# Patient Record
Sex: Female | Born: 1975 | Race: White | Hispanic: No | Smoking: Current every day smoker
Health system: Southern US, Community
[De-identification: ages and names within clinical notes are randomized; demographics above are authoritative.]

## PROBLEM LIST (undated history)

## (undated) DIAGNOSIS — F419 Anxiety disorder, unspecified: Secondary | ICD-10-CM

## (undated) DIAGNOSIS — K219 Gastro-esophageal reflux disease without esophagitis: Secondary | ICD-10-CM

## (undated) DIAGNOSIS — S1121XA Laceration without foreign body of pharynx and cervical esophagus, initial encounter: Secondary | ICD-10-CM

## (undated) DIAGNOSIS — F1110 Opioid abuse, uncomplicated: Secondary | ICD-10-CM

## (undated) HISTORY — PX: TONSILLECTOMY: SUR1361

## (undated) HISTORY — PX: NASAL SINUS SURGERY: SHX719

## (undated) HISTORY — PX: ABDOMINAL HYSTERECTOMY: SHX81

## (undated) HISTORY — PX: CHOLECYSTECTOMY: SHX55

---

## 2004-05-08 ENCOUNTER — Emergency Department: Payer: Self-pay | Admitting: Emergency Medicine

## 2004-06-03 ENCOUNTER — Observation Stay: Payer: Self-pay | Admitting: Unknown Physician Specialty

## 2004-06-10 ENCOUNTER — Observation Stay: Payer: Self-pay | Admitting: Obstetrics & Gynecology

## 2004-06-13 ENCOUNTER — Inpatient Hospital Stay: Payer: Self-pay | Admitting: Unknown Physician Specialty

## 2006-03-05 ENCOUNTER — Emergency Department: Payer: Self-pay | Admitting: Emergency Medicine

## 2006-04-13 ENCOUNTER — Emergency Department: Payer: Self-pay | Admitting: Emergency Medicine

## 2006-04-13 ENCOUNTER — Other Ambulatory Visit: Payer: Self-pay

## 2006-07-26 ENCOUNTER — Other Ambulatory Visit: Payer: Self-pay

## 2006-07-26 ENCOUNTER — Emergency Department: Payer: Self-pay | Admitting: Emergency Medicine

## 2006-08-27 ENCOUNTER — Ambulatory Visit: Payer: Self-pay | Admitting: Surgery

## 2006-11-26 ENCOUNTER — Emergency Department: Payer: Self-pay | Admitting: Emergency Medicine

## 2006-12-04 ENCOUNTER — Inpatient Hospital Stay: Payer: Self-pay | Admitting: Unknown Physician Specialty

## 2007-06-16 ENCOUNTER — Emergency Department: Payer: Self-pay | Admitting: Emergency Medicine

## 2007-07-04 ENCOUNTER — Emergency Department: Payer: Self-pay | Admitting: Emergency Medicine

## 2007-10-27 ENCOUNTER — Emergency Department: Payer: Self-pay | Admitting: Emergency Medicine

## 2008-02-10 ENCOUNTER — Other Ambulatory Visit: Payer: Self-pay

## 2008-02-10 ENCOUNTER — Emergency Department: Payer: Self-pay | Admitting: Emergency Medicine

## 2008-02-14 ENCOUNTER — Other Ambulatory Visit: Payer: Self-pay

## 2008-02-14 ENCOUNTER — Emergency Department: Payer: Self-pay | Admitting: Emergency Medicine

## 2008-02-15 ENCOUNTER — Ambulatory Visit: Payer: Self-pay | Admitting: Oncology

## 2008-02-20 ENCOUNTER — Ambulatory Visit: Payer: Self-pay | Admitting: Oncology

## 2008-03-17 ENCOUNTER — Ambulatory Visit: Payer: Self-pay | Admitting: Oncology

## 2008-04-09 ENCOUNTER — Emergency Department: Payer: Self-pay | Admitting: Emergency Medicine

## 2008-04-09 ENCOUNTER — Other Ambulatory Visit: Payer: Self-pay

## 2008-04-16 ENCOUNTER — Inpatient Hospital Stay: Payer: Self-pay | Admitting: Psychiatry

## 2009-05-20 ENCOUNTER — Emergency Department: Payer: Self-pay | Admitting: Neurology

## 2009-05-21 ENCOUNTER — Emergency Department: Payer: Self-pay | Admitting: Emergency Medicine

## 2009-05-22 ENCOUNTER — Inpatient Hospital Stay: Payer: Self-pay | Admitting: Psychiatry

## 2009-08-24 ENCOUNTER — Observation Stay: Payer: Self-pay | Admitting: Internal Medicine

## 2009-08-25 ENCOUNTER — Inpatient Hospital Stay: Payer: Self-pay | Admitting: Psychiatry

## 2009-09-29 ENCOUNTER — Emergency Department: Payer: Self-pay | Admitting: Emergency Medicine

## 2010-02-20 ENCOUNTER — Emergency Department: Payer: Self-pay | Admitting: Emergency Medicine

## 2010-03-15 ENCOUNTER — Inpatient Hospital Stay: Payer: Self-pay | Admitting: Unknown Physician Specialty

## 2010-08-19 ENCOUNTER — Emergency Department: Payer: Self-pay | Admitting: Emergency Medicine

## 2012-06-07 ENCOUNTER — Emergency Department: Payer: Self-pay | Admitting: Emergency Medicine

## 2012-06-07 LAB — CBC WITH DIFFERENTIAL/PLATELET
Basophil %: 0.7 %
Eosinophil #: 0.2 10*3/uL (ref 0.0–0.7)
Eosinophil %: 1.5 %
HGB: 13.6 g/dL (ref 12.0–16.0)
Lymphocyte #: 3.4 10*3/uL (ref 1.0–3.6)
Lymphocyte %: 31.6 %
MCH: 32.4 pg (ref 26.0–34.0)
MCV: 92 fL (ref 80–100)
Monocyte %: 5.4 %
Neutrophil #: 6.5 10*3/uL (ref 1.4–6.5)
Platelet: 188 10*3/uL (ref 150–440)
RBC: 4.19 10*6/uL (ref 3.80–5.20)
RDW: 12.1 % (ref 11.5–14.5)
WBC: 10.7 10*3/uL (ref 3.6–11.0)

## 2012-06-25 ENCOUNTER — Ambulatory Visit: Payer: Self-pay

## 2012-06-26 ENCOUNTER — Ambulatory Visit: Payer: Self-pay

## 2012-09-07 ENCOUNTER — Emergency Department: Payer: Self-pay | Admitting: Emergency Medicine

## 2013-05-27 ENCOUNTER — Emergency Department: Payer: Self-pay | Admitting: Emergency Medicine

## 2013-05-27 LAB — COMPREHENSIVE METABOLIC PANEL
Albumin: 3.8 g/dL (ref 3.4–5.0)
Alkaline Phosphatase: 98 U/L (ref 50–136)
BUN: 10 mg/dL (ref 7–18)
Bilirubin,Total: 0.6 mg/dL (ref 0.2–1.0)
Calcium, Total: 9 mg/dL (ref 8.5–10.1)
Chloride: 107 mmol/L (ref 98–107)
Co2: 24 mmol/L (ref 21–32)
Creatinine: 0.85 mg/dL (ref 0.60–1.30)
EGFR (African American): >60
Osmolality: 276 (ref 275–301)
Potassium: 3.3 mmol/L — ABNORMAL LOW (ref 3.5–5.1)
SGOT(AST): 20 U/L (ref 15–37)
Sodium: 138 mmol/L (ref 136–145)

## 2013-05-27 LAB — CBC
HGB: 14.3 g/dL (ref 12.0–16.0)
MCH: 32.2 pg (ref 26.0–34.0)
MCHC: 35.3 g/dL (ref 32.0–36.0)
MCV: 91 fL (ref 80–100)
Platelet: 210 10*3/uL (ref 150–440)
WBC: 16.7 10*3/uL — ABNORMAL HIGH (ref 3.6–11.0)

## 2014-02-02 LAB — COMPREHENSIVE METABOLIC PANEL
ALK PHOS: 322 U/L — AB
ALT: 27 U/L (ref 12–78)
ANION GAP: 12 (ref 7–16)
Albumin: 2.2 g/dL — ABNORMAL LOW (ref 3.4–5.0)
BUN: 7 mg/dL (ref 7–18)
Bilirubin,Total: 1.7 mg/dL — ABNORMAL HIGH (ref 0.2–1.0)
CREATININE: 1.28 mg/dL (ref 0.60–1.30)
Calcium, Total: 8.5 mg/dL (ref 8.5–10.1)
Chloride: 103 mmol/L (ref 98–107)
Co2: 22 mmol/L (ref 21–32)
EGFR (African American): >60
EGFR (Non-African Amer.): 54 — ABNORMAL LOW
GLUCOSE: 128 mg/dL — AB (ref 65–99)
OSMOLALITY: 273 (ref 275–301)
Potassium: 2.9 mmol/L — ABNORMAL LOW (ref 3.5–5.1)
SGOT(AST): 58 U/L — ABNORMAL HIGH (ref 15–37)
Sodium: 137 mmol/L (ref 136–145)
TOTAL PROTEIN: 7.9 g/dL (ref 6.4–8.2)

## 2014-02-02 LAB — URINALYSIS, COMPLETE
Bilirubin,UR: NEGATIVE
Glucose,UR: NEGATIVE mg/dL (ref 0–75)
KETONE: NEGATIVE
Nitrite: POSITIVE
Ph: 5 (ref 4.5–8.0)
Protein: 30
RBC,UR: 2 /HPF (ref 0–5)
Specific Gravity: 1.008 (ref 1.003–1.030)
Squamous Epithelial: 10
WBC UR: 150 /HPF (ref 0–5)

## 2014-02-02 LAB — CBC WITH DIFFERENTIAL/PLATELET
BASOS PCT: 0.2 %
Basophil #: 0 10*3/uL (ref 0.0–0.1)
EOS ABS: 0 10*3/uL (ref 0.0–0.7)
Eosinophil %: 0.1 %
HCT: 27.3 % — ABNORMAL LOW (ref 35.0–47.0)
HGB: 9.1 g/dL — AB (ref 12.0–16.0)
LYMPHS ABS: 1.4 10*3/uL (ref 1.0–3.6)
LYMPHS PCT: 5.2 %
MCH: 30.7 pg (ref 26.0–34.0)
MCHC: 33.2 g/dL (ref 32.0–36.0)
MCV: 93 fL (ref 80–100)
MONO ABS: 1.3 x10 3/mm — AB (ref 0.2–0.9)
MONOS PCT: 4.8 %
NEUTROS PCT: 89.7 %
Neutrophil #: 23.7 10*3/uL — ABNORMAL HIGH (ref 1.4–6.5)
PLATELETS: 340 10*3/uL (ref 150–440)
RBC: 2.95 10*6/uL — ABNORMAL LOW (ref 3.80–5.20)
RDW: 13.2 % (ref 11.5–14.5)
WBC: 26.4 10*3/uL — ABNORMAL HIGH (ref 3.6–11.0)

## 2014-02-02 LAB — LIPASE, BLOOD: LIPASE: 111 U/L (ref 73–393)

## 2014-02-02 LAB — HCG, QUANTITATIVE, PREGNANCY: Beta Hcg, Quant.: 1 m[IU]/mL

## 2014-02-03 ENCOUNTER — Inpatient Hospital Stay: Payer: Self-pay | Admitting: Internal Medicine

## 2014-02-03 LAB — MAGNESIUM
MAGNESIUM: 1.6 mg/dL — AB
Magnesium: 2.3 mg/dL

## 2014-02-03 LAB — POTASSIUM: POTASSIUM: 3.1 mmol/L — AB (ref 3.5–5.1)

## 2014-02-04 LAB — CBC WITH DIFFERENTIAL/PLATELET
Basophil #: 0 10*3/uL (ref 0.0–0.1)
Basophil %: 0.4 %
EOS PCT: 0.3 %
Eosinophil #: 0 10*3/uL (ref 0.0–0.7)
HCT: 22.2 % — ABNORMAL LOW (ref 35.0–47.0)
HGB: 7.3 g/dL — AB (ref 12.0–16.0)
LYMPHS ABS: 2.1 10*3/uL (ref 1.0–3.6)
Lymphocyte %: 18.1 %
MCH: 31.2 pg (ref 26.0–34.0)
MCHC: 32.9 g/dL (ref 32.0–36.0)
MCV: 95 fL (ref 80–100)
Monocyte #: 0.9 x10 3/mm (ref 0.2–0.9)
Monocyte %: 7.3 %
Neutrophil #: 8.7 10*3/uL — ABNORMAL HIGH (ref 1.4–6.5)
Neutrophil %: 73.9 %
PLATELETS: 336 10*3/uL (ref 150–440)
RBC: 2.34 10*6/uL — ABNORMAL LOW (ref 3.80–5.20)
RDW: 13.7 % (ref 11.5–14.5)
WBC: 11.8 10*3/uL — ABNORMAL HIGH (ref 3.6–11.0)

## 2014-02-04 LAB — COMPREHENSIVE METABOLIC PANEL
ALT: 26 U/L
ANION GAP: 5 — AB (ref 7–16)
Albumin: 1.6 g/dL — ABNORMAL LOW (ref 3.4–5.0)
Alkaline Phosphatase: 210 U/L — ABNORMAL HIGH
BILIRUBIN TOTAL: 0.7 mg/dL (ref 0.2–1.0)
BUN: 5 mg/dL — AB (ref 7–18)
CHLORIDE: 114 mmol/L — AB (ref 98–107)
Calcium, Total: 8 mg/dL — ABNORMAL LOW (ref 8.5–10.1)
Co2: 25 mmol/L (ref 21–32)
Creatinine: 1.13 mg/dL (ref 0.60–1.30)
EGFR (Non-African Amer.): >60
Glucose: 88 mg/dL (ref 65–99)
OSMOLALITY: 284 (ref 275–301)
Potassium: 3.7 mmol/L (ref 3.5–5.1)
SGOT(AST): 31 U/L (ref 15–37)
SODIUM: 144 mmol/L (ref 136–145)
Total Protein: 6.2 g/dL — ABNORMAL LOW (ref 6.4–8.2)

## 2014-02-04 LAB — RAPID INFLUENZA A&B ANTIGENS (ARMC ONLY)

## 2014-02-05 LAB — BASIC METABOLIC PANEL
Anion Gap: 6 — ABNORMAL LOW (ref 7–16)
BUN: 7 mg/dL (ref 7–18)
CALCIUM: 8.2 mg/dL — AB (ref 8.5–10.1)
CHLORIDE: 110 mmol/L — AB (ref 98–107)
CO2: 25 mmol/L (ref 21–32)
Creatinine: 1.03 mg/dL (ref 0.60–1.30)
EGFR (African American): >60
EGFR (Non-African Amer.): >60
GLUCOSE: 139 mg/dL — AB (ref 65–99)
Osmolality: 281 (ref 275–301)
POTASSIUM: 3.4 mmol/L — AB (ref 3.5–5.1)
Sodium: 141 mmol/L (ref 136–145)

## 2014-02-05 LAB — CBC WITH DIFFERENTIAL/PLATELET
BASOS PCT: 0.5 %
Basophil #: 0 10*3/uL (ref 0.0–0.1)
EOS PCT: 1.1 %
Eosinophil #: 0.1 10*3/uL (ref 0.0–0.7)
HCT: 23.8 % — ABNORMAL LOW (ref 35.0–47.0)
HGB: 7.8 g/dL — AB (ref 12.0–16.0)
Lymphocyte #: 1.6 10*3/uL (ref 1.0–3.6)
Lymphocyte %: 23.8 %
MCH: 31 pg (ref 26.0–34.0)
MCHC: 32.7 g/dL (ref 32.0–36.0)
MCV: 95 fL (ref 80–100)
MONOS PCT: 5.1 %
Monocyte #: 0.4 x10 3/mm (ref 0.2–0.9)
Neutrophil #: 4.8 10*3/uL (ref 1.4–6.5)
Neutrophil %: 69.5 %
Platelet: 398 10*3/uL (ref 150–440)
RBC: 2.5 10*6/uL — ABNORMAL LOW (ref 3.80–5.20)
RDW: 13.6 % (ref 11.5–14.5)
WBC: 6.9 10*3/uL (ref 3.6–11.0)

## 2014-02-05 LAB — URINE CULTURE

## 2014-02-06 LAB — URINE CULTURE

## 2014-02-07 LAB — CULTURE, BLOOD (SINGLE)

## 2014-02-25 ENCOUNTER — Emergency Department: Payer: Self-pay | Admitting: Student

## 2014-02-25 LAB — URINALYSIS, COMPLETE
BILIRUBIN, UR: NEGATIVE
BLOOD: NEGATIVE
Bacteria: NONE SEEN
Glucose,UR: NEGATIVE mg/dL (ref 0–75)
Hyaline Cast: 6
Ketone: NEGATIVE
Nitrite: NEGATIVE
PROTEIN: NEGATIVE
Ph: 5 (ref 4.5–8.0)
SPECIFIC GRAVITY: 1.024 (ref 1.003–1.030)

## 2014-02-25 LAB — LIPASE, BLOOD: Lipase: 186 U/L (ref 73–393)

## 2014-02-25 LAB — COMPREHENSIVE METABOLIC PANEL
ALK PHOS: 98 U/L
ALT: 13 U/L — AB
Albumin: 4 g/dL (ref 3.4–5.0)
Anion Gap: 11 (ref 7–16)
BILIRUBIN TOTAL: 0.3 mg/dL (ref 0.2–1.0)
BUN: 10 mg/dL (ref 7–18)
CREATININE: 1.25 mg/dL (ref 0.60–1.30)
Calcium, Total: 9.4 mg/dL (ref 8.5–10.1)
Chloride: 109 mmol/L — ABNORMAL HIGH (ref 98–107)
Co2: 19 mmol/L — ABNORMAL LOW (ref 21–32)
EGFR (African American): >60
EGFR (Non-African Amer.): 55 — ABNORMAL LOW
GLUCOSE: 106 mg/dL — AB (ref 65–99)
Osmolality: 277 (ref 275–301)
POTASSIUM: 3.8 mmol/L (ref 3.5–5.1)
SGOT(AST): 18 U/L (ref 15–37)
Sodium: 139 mmol/L (ref 136–145)
TOTAL PROTEIN: 8.8 g/dL — AB (ref 6.4–8.2)

## 2014-02-25 LAB — CBC
HCT: 37.8 % (ref 35.0–47.0)
HGB: 12.5 g/dL (ref 12.0–16.0)
MCH: 31.6 pg (ref 26.0–34.0)
MCHC: 33.2 g/dL (ref 32.0–36.0)
MCV: 95 fL (ref 80–100)
Platelet: 271 10*3/uL (ref 150–440)
RBC: 3.97 10*6/uL (ref 3.80–5.20)
RDW: 14.8 % — ABNORMAL HIGH (ref 11.5–14.5)
WBC: 8.6 10*3/uL (ref 3.6–11.0)

## 2014-03-02 LAB — CULTURE, BLOOD (SINGLE)

## 2014-07-03 ENCOUNTER — Emergency Department: Payer: Self-pay | Admitting: Emergency Medicine

## 2014-07-03 LAB — BASIC METABOLIC PANEL
Anion Gap: 7 (ref 7–16)
BUN: 7 mg/dL (ref 7–18)
CALCIUM: 8.5 mg/dL (ref 8.5–10.1)
CO2: 27 mmol/L (ref 21–32)
Chloride: 111 mmol/L — ABNORMAL HIGH (ref 98–107)
Creatinine: 0.93 mg/dL (ref 0.60–1.30)
EGFR (African American): >60
EGFR (Non-African Amer.): >60
GLUCOSE: 76 mg/dL (ref 65–99)
OSMOLALITY: 285 (ref 275–301)
Potassium: 3.4 mmol/L — ABNORMAL LOW (ref 3.5–5.1)
SODIUM: 145 mmol/L (ref 136–145)

## 2014-07-03 LAB — DRUG SCREEN, URINE
Amphetamines, Ur Screen: NEGATIVE (ref ?–1000)
BARBITURATES, UR SCREEN: NEGATIVE (ref ?–200)
Benzodiazepine, Ur Scrn: NEGATIVE (ref ?–200)
Cannabinoid 50 Ng, Ur ~~LOC~~: POSITIVE (ref ?–50)
Cocaine Metabolite,Ur ~~LOC~~: NEGATIVE (ref ?–300)
MDMA (ECSTASY) UR SCREEN: NEGATIVE (ref ?–500)
METHADONE, UR SCREEN: NEGATIVE (ref ?–300)
OPIATE, UR SCREEN: NEGATIVE (ref ?–300)
Phencyclidine (PCP) Ur S: NEGATIVE (ref ?–25)
TRICYCLIC, UR SCREEN: NEGATIVE (ref ?–1000)

## 2014-07-03 LAB — URINALYSIS, COMPLETE
Bacteria: NONE SEEN
Bilirubin,UR: NEGATIVE
Glucose,UR: NEGATIVE mg/dL (ref 0–75)
Hyaline Cast: 2
Ketone: NEGATIVE
LEUKOCYTE ESTERASE: NEGATIVE
Nitrite: NEGATIVE
PH: 6 (ref 4.5–8.0)
PROTEIN: NEGATIVE
RBC,UR: 1 /HPF (ref 0–5)
Specific Gravity: 1.009 (ref 1.003–1.030)
Squamous Epithelial: 6
WBC UR: 2 /HPF (ref 0–5)

## 2014-07-03 LAB — CBC
HCT: 38.8 % (ref 35.0–47.0)
HGB: 13.1 g/dL (ref 12.0–16.0)
MCH: 31.6 pg (ref 26.0–34.0)
MCHC: 33.7 g/dL (ref 32.0–36.0)
MCV: 94 fL (ref 80–100)
PLATELETS: 168 10*3/uL (ref 150–440)
RBC: 4.15 10*6/uL (ref 3.80–5.20)
RDW: 13.9 % (ref 11.5–14.5)
WBC: 8.6 10*3/uL (ref 3.6–11.0)

## 2014-07-03 LAB — TROPONIN I

## 2014-08-31 ENCOUNTER — Emergency Department: Payer: Self-pay | Admitting: Emergency Medicine

## 2014-09-30 ENCOUNTER — Observation Stay: Payer: Self-pay | Admitting: Internal Medicine

## 2014-11-07 NOTE — H&P (Signed)
PATIENT NAME:  Anne, Anne [name] MR#:  [id number] DATE OF BIRTH:  [birth year]  DATE OF ADMISSION:  02/03/2014  REFERRING PHYSICIAN: [place]. Dolores Frame, MD  PRIMARY CARE PHYSICIAN: [facility], Dr. Ephraim Hamburger.  CHIEF COMPLAINT: Fever.   HISTORY OF PRESENT ILLNESS: A 39 year old female with a history of GERD, anxiety, depression, not otherwise specified, presenting with fever. She describes 2-week duration of intermittent fever with associated chills and generalized fatigue and malaise. She denotes having occasional dysuria throughout the same time. However, denies any change in urinary frequency. She denies any further symptomatology including cough, shortness of breath, chest pain, change in bowel habits, abdominal pain. She was noted to be febrile on admission to the Emergency Department. Currently complaining only of fatigue, generalized malaise.  REVIEW OF SYSTEMS:  CONSTITUTIONAL: Positive for fevers, chills, weakness, fatigue as described above.  EYES: Denies blurred vision, double vision, or eye pain.  EARS, NOSE, THROAT: Denies tinnitus, ear pain, or hearing loss.  RESPIRATORY: Denies cough, wheeze, shortness of breath.  CARDIOVASCULAR: Denies chest pain, palpitations, edema.  GASTROINTESTINAL: Denies nausea, vomiting, diarrhea, abdominal pain.  GENITOURINARY: Positive for dysuria with no change in urinary frequency.  ENDOCRINE: Denies nocturia or thyroid problems. HEMATOLOGIC AND LYMPHATIC: Denies easy bruising, bleeding.  SKIN: Denies rash or lesions.  MUSCULOSKELETAL: Denies pain in neck, back, shoulder, knees, hips or arthritic symptoms.  NEUROLOGIC: Denies paralysis, paresthesia.  PSYCHIATRIC: Denies anxiety or depressive symptoms.  Otherwise, full review of systems performed by me is negative.   PAST MEDICAL HISTORY: Gastroesophageal reflux disease, anxiety, depression not otherwise specified, history of substance abuse.   SOCIAL HISTORY: Positive for tobacco usage  as well as occasional alcohol usage. Denies any current drug usage.   FAMILY HISTORY: Denies any known cardiovascular or pulmonary disorders.   ALLERGIES: ALL NAUSEA MEDICATIONS, PHENERGAN AND ZOFRAN, TAPE, AS WELL AS PERCOCET.   HOME MEDICATIONS: Include ibuprofen 800 mg p.o. 3 times daily as needed for pain, Norco 5/325 mg p.o. q. 4 hours as needed for pain, Pepcid 20 mg p.o. b.i.d.  PHYSICAL EXAMINATION:  VITAL SIGNS: Temperature 100.6 degrees Fahrenheit, heart rate 125, respirations 22, blood pressure 109/62, saturating 99% on room air, weight 78.5 kg, BMI of 30.7.  GENERAL: Well-nourished, well-developed, Caucasian female, currently in no acute distress.  HEAD: Normocephalic, atraumatic.  EYES: Pupils equal, round and reactive to light. Extraocular muscles intact. No scleral icterus.  MOUTH: Moist mucous membranes. Dentition intact. No abscess noted. EARS, NOSE, THROAT: Clear without exudate. No external lesions.  NECK: Supple. No thyromegaly. No nodules. No JVD.  PULMONARY: Clear to auscultation bilaterally without wheezes, rales, or rhonchi. No use of accessory muscles. Good respiratory effort.  CHEST: Nontender to palpation.  CARDIOVASCULAR: S1, S2, regular rate and rhythm. No murmurs, rubs, or gallops. No edema. Pedal pulses 2+ bilaterally.  GASTROINTESTINAL: Soft, nontender, nondistended. No masses. Positive bowel sounds. No hepatosplenomegaly. Positive right-sided CVA tenderness.  MUSCULOSKELETAL: No swelling, clubbing, or edema. Range of motion full in all extremities.  NEUROLOGIC: Cranial nerves II through XII intact. No gross focal neurological deficits. Sensation intact. Reflexes intact.  SKIN: No ulceration, lesions, rashes, or cyanosis. Skin warm, dry. Turgor intact.  PSYCHIATRIC: Mood and affect within normal limits. The patient is awake, alert, oriented x 3. Insight and judgment intact.   LABORATORY DATA: Sodium 137, potassium 2.9, chloride 103, bicarbonate 22, BUN 7,  creatinine 1.28, glucose 128. LFTs: Albumin 2.2, bilirubin 1.7, alkaline phosphatase 322, AST 58, ALT 27, WBC 26.4, hemoglobin 9.1, platelets of 340,000. Urinalysis: WBC  150, RBCs 2, leukocyte esterase 2+, bacteria 3+, lactic acid of 1.5.  IMAGING: He has had an abdominal ultrasound performed revealing no acute abdominal processes. Chest x-ray performed, no acute cardiopulmonary process. CT, abdomen and pelvis, performed, no acute intra-abdominal findings. Does have a nonobstructive left renal calculi.   ASSESSMENT AND PLAN: A 39 year old Caucasian female with a history of gastroesophageal reflux disease, anxiety, depression, presenting with fever.  1.  Sepsis. Meeting septic criteria by heart rate, leukocytosis, respiratory rate secondary to urinary tract infection. Panculture including blood and urine. Antibiotic coverage with ceftriaxone. Intravenous fluid hydration to keep mean arterial pressure greater than 65.  2.  Hypokalemia. Replace to a goal of 4 to 5. We will also check a magnesium level.  3.  Transaminitis with normal right upper quadrant ultrasound. Follow trend.  4.  Venous thromboembolism prophylaxis with heparin subcutaneous.  CODE STATUS: The patient is full code.   TIME SPENT: 45 minutes    ____________________________ Cletis Athensavid K. Journie Howson, MD dkh:sk D: 02/03/2014 01:38:11 ET T: 02/03/2014 02:14:58 ET JOB#: [id number]  cc: Cletis Athensavid K. Maryse Brierley, MD, <Dictator> Zahava Quant Synetta ShadowK Harsha Yusko MD ELECTRONICALLY SIGNED 02/03/2014 20:42

## 2014-11-07 NOTE — Discharge Summary (Signed)
PATIENT NAME:  Anne [name], [name] MR#:  [id number] DATE OF BIRTH:  [birth year]  DATE OF ADMISSION:  02/03/2014 DATE OF DISCHARGE:  02/05/2014  DISCHARGE DIAGNOSES: 1.  Sepsis present on admission secondary to urinary tract infection, now resolved.  2.  Hypokalemia, repleted and resolved.  3.  Escherichia coli urinary tract infection.   SECONDARY DIAGNOSES: 1.  Gastroesophageal reflux disease.  2.  Anxiety.  3.  Depression.   CONSULTATIONS: None.   PROCEDURES AND RADIOLOGY: Chest x-ray on July 20, showed no acute cardiopulmonary disease.   CT scan of the abdomen and pelvis with contrast on July 20, showed no acute abdominal pelvic findings. Nonobstructing left renal calculi. No biliary dilatation. Status post cholecystectomy.   Abdominal ultrasound on July 20, showed no acute pathology.   Chest x-ray on the July 22, showed no acute cardiopulmonary disease.   MAJOR LABORATORY PANEL: Urinalysis on admission showed 150 WBCs, 2+ leukocyte esterase, positive nitrite, 3+ bacteria.   Blood cultures x2 were negative.   Urine culture grew more than 100,000 colonies of Escherichia coli.   Repeat culture for urine was negative on July 22.   HISTORY AND SHORT HOSPITAL COURSE: The patient is a 39 year old female with the above-mentioned medical problems who was admitted for fever and was found to have sepsis which was present on admission, secondary to UTI. Please see Dr. Deatra Inaavid Hower's dictated history and physical for further details. The patient was started on IV antibiotic and was slowly improving. Her urine culture was positive for Escherichia coli, pansensitive and her antibiotic was switched to oral form and she was discharged home in stable condition. She did have some hypokalemia, which was repleted and resolved.  PHYSICAL EXAMINATION: VITAL SIGNS:  On the date of discharge as follows: Temperature 98.6, heart rate 77 per minute, respirations 20 per minute, blood pressure 120/83 mmHg.  She  is saturating 99% on room air.  CARDIOVASCULAR: S1, S2 normal. No murmurs, rubs or gallop.  LUNGS: Clear to auscultation bilaterally. No wheezing, rales, rhonchi or crepitation.  ABDOMEN: Soft, benign.  NEUROLOGIC: Nonfocal examination.   All other physical examination remained at baseline.   DISCHARGE MEDICATIONS: 1.  Paxil 40 mg p.o. daily.  2.  Protonix 20 mg p.o. b.i.d.  3.  Klonopin 1 mg p.o. b.i.d.  4.  Levaquin 500 mg p.o. daily for 8 more days.  5.  Ibuprofen 400 mg p.o. every 6 hours as needed.  6.  Gabapentin 800 mg p.o. 4 times a day.   DISCHARGE DIET: Regular.   DISCHARGE ACTIVITY: As tolerated.   DISCHARGE INSTRUCTIONS AND FOLLOWUP:  The patient was [facility] Dr. Ephraim HamburgerMancheno in 1-2 weeks.    TOTAL TIME DISCHARGING THIS PATIENT: 40 minutes.    ____________________________ Ellamae SiaVipul S. Sherryll BurgerShah, MD vss:ds D: 02/05/2014 19:39:06 ET T: 02/05/2014 20:05:07 ET JOB#: [id number]  cc: Tavon Magnussen S. Sherryll BurgerShah, MD, <Dictator> Ellamae SiaVIPUL S [facility] MD ELECTRONICALLY SIGNED 02/05/2014 20:49

## 2014-11-15 NOTE — Consult Note (Signed)
PATIENT NAME:  Anne [name], Anne [name] MR#:  [id number] DATE OF BIRTH:  Anne [name] 21, 1977  DATE OF CONSULTATION:  09/30/2014  CONSULTING PHYSICIAN:  Audery Amel, MD  IDENTIFYING INFORMATION AND CHIEF COMPLAINT:  A 39 year old woman brought to the Emergency Room after an intentional overdose on gabapentin.   CHIEF COMPLAINT:  "I wanted to prove a point."   HISTORY OF PRESENT ILLNESS:  Information is from the patient and the chart. The patient was brought in to the Emergency Room probably late yesterday by her family after taking an overdose of gabapentin. The patient claims that she took 100 of the 800 mg strength pills. She said she did this in full view of her family, making it clear to them what she was doing. She claims that this was a spur of the moment, impulsive decision and that she had no suicidal ideation whatsoever. Her rationale is that her family continues to treat her like a drug addict, so she is going to "prove a point" by acting like a drug addict. The irrationality of this is lost on her. She denies that she has been particularly depressed, but then will immediately state that of course her mood is depression all the time. At the same time, she claims that she is a "very happy person." She said that she has been isolating herself socially and does nothing but stay in her bedroom for days at a time, at the same time saying that she is someone who "loves to be around people." She denies that she has been having any suicidal ideation. She seems to have chronically erratic sleep problems, very little social activity, very little in the way of social interaction. She denies any hallucinations. She denies that she has been abusing any drugs or alcohol. Specifically, she denies the use of any methadone, and she is well aware that there is methadone in her admission drug screen. She says that she is very proud of herself that she has stayed clean from drug abuse for about 4 years now. She is currently  getting outpatient psychiatric care and sees Dr. Jaclynn Major. She is currently taking citalopram 40 mg a day. The patient is convinced that it is causing her to have "seizures."   PAST PSYCHIATRIC HISTORY:  Long history of mood instability corresponding long history of substance abuse. Past history of indiscriminate abuse of narcotics and benzodiazepines primarily. The patient states now that she has been off of both narcotics and benzodiazepines for about 4 years. She has a history of admission to the hospital after overdoses on medications including gabapentin and Fioricet in the past. At one point in the interview, she told me that the reason she took the overdose of gabapentin is that she knows from experience that an overdose of it will not hurt her. A few minutes later, she absolutely denied it when I pointed out that she had overdosed on gabapentin in the past. The patient claims that she has only tried to kill herself once and that was in 2009. Other overdoses were similarly erratic behaviors.   SOCIAL HISTORY:  The patient lives with her parents. Brother apparently is in the house at least part of the time. She is divorced. She has children, but has only occasional contact with them. Not working. Minimal social activity. She apparently is completely dependent on her parents, but at the same time accuses of them of "hating" her.   PAST MEDICAL HISTORY:  She claims that she has recently developed seizures. She says she has  had 3 of them so far. They have not been evaluated or worked up in any way. She is convinced that they are caused by the citalopram. Nevertheless, she has been continuing to take it.   SUBSTANCE ABUSE HISTORY:  As noted above, history of abuse of mostly benzodiazepines and narcotics. She has been to inpatient programs in the past.   FAMILY HISTORY:  She reports that her mother also has severe mood instability.   CURRENT MEDICATIONS:  The patient is apparently prescribed citalopram  40 mg a day, gabapentin 800 mg 4 times a day, pantoprazole 40 mg a day, and Aleve once every 8 hours. She totally denies any use of narcotics of any sort.   ALLERGIES:  SHE STATES SHE IS ALLERGIC TO ALL ANTIN[place]SEA MEDICINES INCLUDING COMPAZINE, PHENERGAN, AND ZOFRAN.   REVIEW OF SYSTEMS:  Currently has no specific complaints. She says that her mood is fine. She denies hallucinations. She denies suicidal ideation. No specific physical problems.   MENTAL STATUS EXAMINATION:  Adequately groomed woman who looks her stated age. Cooperative with the interview. Good eye contact. Psychomotor activity is normal. Speech is normal rate, tone, and volume. Affect is overall euthymic, although at times she will get somewhat guarded and irritable transiently. Thoughts are illogical, but not in a psychotic sort of way. She has very poor insight into her own behavior and how it reflects on her character and her mood. She denies any suicidal or homicidal ideation. She is alert and oriented x 4. She can repeat 3 objects immediately and remembers all 3 at 3 minutes. Judgment and insight are poor. Intelligence is normal.   LABORATORY RESULTS:  Urinalysis shows 1+ blood, borderline but probably not infected. Drug screen is positive for both cannabis and methadone. Pregnancy test is negative. EKG is unremarkable. TSH is normal. Magnesium is normal. CBC is unremarkable. Chemistry panel is unremarkable.   VITAL SIGNS:  Currently, blood pressure 96/65, respirations 18, pulse 75, temperature 98.4.   ASSESSMENT:  A 39 year old woman with a history of chronic mood instability and behavior problems. She claims she is no longer abusing substances. She does seem to have been off of benzodiazepines. Probably still at least intermittently using marijuana. It is unclear about the methadone since she absolutely swears that she is not taking any. I suspect that she is telling the truth, that the overdose on medicine was not actually in any  way intended to kill her. This sort of impulsive, erratic, self-destructive behavior is typical of borderline personality disorder, so is the rapid return to baseline as well as the very poor self-comprehension of her own motives and feelings.   TREATMENT PLAN:  I am not changing any of her medicine right now. I do not think there is any indication that a medicine change is likely to affect overall clinical status. At this point in the evening, I am leaving her under involuntary commitment. We will re-evaluate tomorrow, but I suspect she will not require psychiatric hospitalization, as she is probably back to her baseline. The patient is given some supportive and psychoeducational counseling.   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   AXIS II:  Borderline personality disorder.   AXIS III:  Status post overdose of gabapentin evidently without (Dictation Anomaly) <<withoutMISSING TEXT>> sequela, gastric reflux symptoms.    ____________________________ Audery AmelJohn T. Latora Quarry, MD jtc:nb  D: 09/30/2014 21:31:26 ET T: 09/30/2014 22:00:16 ET JOB#: [id number]  cc: Audery Amel, MD, <Dictator> Audery Amel MD ELECTRONICALLY SIGNED 10/02/2014 10:15

## 2014-11-15 NOTE — Consult Note (Signed)
Brief Consult Note: Diagnosis: borderline personality disorder.   Patient was seen by consultant.   Consult note dictated.   Comments: Psychiatry: PAtient seen and chart reviewed. PAtient took huge overdose "to prove a point". Currently denies SI and denies recent SA. Will not yet DC IVC. Will follow tomorrow.  Electronic Signatures: Audery Amellapacs, Terran Hollenkamp T (MD)  (Signed 16-Mar-16 17:53)  Authored: Brief Consult Note   Last Updated: 16-Mar-16 17:53 by Audery Amellapacs, Ayo Guarino T (MD)

## 2014-11-15 NOTE — Discharge Summary (Signed)
PATIENT NAME:  Anne [name], Anne [name] MR#:  [id number] DATE OF BIRTH:  [birth year]  DATE OF ADMISSION:  09/30/2014 DATE OF DISCHARGE:  10/01/2014  PRIMARY CARE PHYSICIAN:   Dr. Ephraim HamburgerMancheno.  DISCHARGE DIAGNOSES: 1. Seizure, possibly due to interaction with Celexa.  2. Drug overdose of gabapentin.  3. Depression.  4. Obesity.  5. Borderline personality disorder.   CONDITION: Stable.   CODE STATUS: Full code.   HOME MEDICATION:   Protonix 40 mg p.o. b.i.d.   DIET: Low fat and low cholesterol diet.   ACTIVITY: As tolerated.   FOLLOW-UP CARE:  With PCP and Dr. Toni Amendlapacs within 1-2 weeks.   REASON FOR ADMISSION: Seizure and gabapentin overdose.   HOSPITAL COURSE: The patient is a 39 year old Caucasian female with a history of depression and cervical neuropathy.   Presents  to the ED with seizure. The patient presented to the ED after an overdose of 114 gabapentin 800 mg tablets.   The patient was  observed in ED for 8 hours when  patient had a seizure which lasted about 1 minute.  The seizure stopped with Ativan injection. For detailed history and physical examination, please refer for the admission note dictated by Dr. Sheryle Hailiamond.  1. Seizure which is possibly due to interaction of Celexa. The patient's Celexa was discontinued. The patient was placed for observation for 24 hours.  2. Gabapentin drug overdose.   Gabapentin  was discontinued. The patient was treated with IV fluid support and the patient has no symptoms. 3. Depression. The patient was placed on IVC according to Dr. Toni Amendlapacs' recommendation.  The patient denies any suicidal ideation and she denies depression today. Dr. Toni Amendlapacs evaluated  the patient and suggested the patient does not need IVC and can be discharged home. The patient has a borderline personality disorder.   The patient has no complaints. Her vital signs are stable. She is clinically stable and will be discharged to home today. I discussed the patient's discharge plan with the  patient, nurse, and case manager.   TIME SPENT: About 42 minutes.    ____________________________ Shaune PollackQing Margene Cherian, MD qc:tr D: 10/01/2014 16:12:22 ET T: 10/01/2014 16:39:37 ET JOB#: [id number]  cc: Shaune PollackQing Svara Twyman, MD, <Dictator> Shaune PollackQING Esiah Bazinet MD ELECTRONICALLY SIGNED 10/02/2014 16:11

## 2014-11-15 NOTE — Consult Note (Signed)
Psychiatry: PAtient seen and chart reviewed. Patient has no new complaints. Reports her mood as good and denies any SI. LOS all neg awake and alert and appropriately interactive. Affect euthymic. Lucid and without psychosis. Denie any wish to harm self.  plan to follow up with Dr Jaclynn MajorGreason. Also plans to try to decrease her stress. Psychoeducation and supportive counceling.  the IVC.  DC suicide precautions. PAtient can be discharged home. Borderline personality disorder  Electronic Signatures: Semisi Biela, Jackquline DenmarkJohn T (MD)  (Signed on 17-Mar-16 15:08)  Authored  Last Updated: 17-Mar-16 15:08 by Audery Amellapacs, Elliotte Marsalis T (MD)

## 2014-11-15 NOTE — H&P (Signed)
PATIENT NAME:  Anne [name], Anne [name] MR#:  [name] DATE OF BIRTH:  02-19-1976  DATE OF ADMISSION:  09/30/2014  REFERRING PHYSICIAN: Su Leyobert L. Kinner, MD  PRIMARY CARE PHYSICIAN: Anne FleetingAdrian Mancheno Revelo, MD  ADMISSION DIAGNOSES: Seizure and gabapentin overdose.   HISTORY OF PRESENT ILLNESS: This is a 39 year old Caucasian female who presents to the Emergency Department following an overdose of 114 gabapentin 800 mg tablets. The patient had been observed in the Emergency Department for involuntary commitment for 8 hours when she had a seizure. The seizure was tonic-clonic and lasted for 1 minute. It stopped with Ativan injection. The patient was briefly postictal, but rapidly became more alert and oriented. She denies any chest pain or shortness of breath. The patient states that she has had a seizure once before approximately 3 months ago shortly after she started taking Anne [name]. Poison Control was contacted who recommend recommended a 24-hour observation. Due to her seizure activity and possible psychiatric conditions, the Emergency Department called for admission.   REVIEW OF SYSTEMS:  CONSTITUTIONAL: The patient denies fever or weakness.  EYES: Denies blurred vision or inflammation.  EARS, NOSE AND THROAT: Denies tinnitus or sore throat.  RESPIRATORY: Denies cough or shortness of breath.  CARDIOVASCULAR: Denies chest pain, palpitations, orthopnea, or paroxysmal nocturnal dyspnea.  GASTROINTESTINAL: Denies nausea, vomiting, diarrhea, or abdominal pain.  GENITOURINARY: Denies dysuria, increased frequency, or hesitancy of urination.  ENDOCRINE: Denies polyuria or polydipsia.  HEMATOLOGIC AND LYMPHATIC: Denies easy bruising or bleeding.  INTEGUMENTARY: Denies rashes or lesions.  MUSCULOSKELETAL: Denies myalgias, but admits to arthralgias particularly in her neck.  NEUROLOGIC: Denies numbness in her extremities or dysarthria.  PSYCHIATRIC: Admits to depression, but denies suicidal ideation.    PAST MEDICAL HISTORY: Depression and cervical neuropathy and radiculopathy.   PAST SURGICAL HISTORY: Hysterectomy, Baker cyst removal from her right knee, and tonsillectomy and adenoidectomy.   SOCIAL HISTORY: The patient has a 23 pack-year smoking history. She denies alcohol use. 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   FAMILY HISTORY: Diabetes type 2 and cardiovascular disease run throughout the family.   MEDICATIONS:  1.  Aleve sodium 220 mg 1 tablet p.o. every 8 hours as needed for pain and inflammation.  2.  Citalopram 20 mg 1 tablet p.o. daily.  3.  Gabapentin 800 mg 1 tablet p.o. 4 times a day.  4.  Pantoprazole 40 mg delayed release 1 tablet p.o. b.i.d.   ALLERGIES: ANTI-EMETICS INCLUDING COMPAZINE, PHENERGAN, ZOFRAN. THE PATIENT IS ALSO ALLERGIC TO TAPE.   PERTINENT LABORATORY RESULTS AND RADIOGRAPHIC FINDINGS: Serum glucose is 102, BUN is 15, creatinine 0.99, serum sodium is 140, potassium is 3.5, chloride is 105, bicarbonate 24, calcium is 9.4, magnesium 2.4. Ethanol level is less than 5. Serum albumin is 4.2, alkaline phosphatase is 95, AST is 25, ALT is 21. Thyroid stimulating hormone is 1.93. Urine drug screen is positive for methadone as well as cannabinoids. White blood cell count is 8.5, hemoglobin is 14.6, hematocrit 42.5, platelet count 219,000. MCV is 92. Urinalysis is negative for infection. Salicylate level is less than 4. Acetaminophen level is less than 10. There is no imaging.   PHYSICAL EXAMINATION:  VITAL SIGNS: Temperature is 98.1, pulse 82, respirations 18, blood pressure 105/64, pulse oximetry is 100% on room air.  GENERAL: The patient is alert and oriented x 3 in no apparent distress.  HEENT: Normocephalic, atraumatic. Pupils equal, round, and reactive to light and accommodation. Extraocular movements are intact. Mucous membranes  are moist.  NECK: Trachea is midline. No adenopathy. Thyroid is nonpalpable and nontender.   CHEST: Symmetric and atraumatic.  CARDIOVASCULAR: Regular rate and rhythm. Normal S1, S2. No rubs, clicks, or murmurs appreciated.  LUNGS: Clear to auscultation bilaterally. Normal effort and excursion.  ABDOMEN: Positive bowel sounds. Soft, nontender, nondistended. No hepatosplenomegaly.  GENITOURINARY: Deferred.  MUSCULOSKELETAL: The patient moves all 4 extremities equally. There is 5/5 strength in upper and lower extremities bilaterally.  SKIN: Warm and dry. There are no rashes or lesions.  EXTREMITIES: No clubbing, cyanosis, or edema.  NEUROLOGIC: Cranial nerves II-XII are grossly intact.  PSYCHIATRIC: Mood is normal. Affect is congruent. The patient has good judgment and insight into her medical condition.   ASSESSMENT AND PLAN: This is a 39 year old female admitted for seizure following gabapentin overdose.  1.  Seizure. The patient bit her tongue and did have a brief postictal period. She was not noted to have any arrhythmias before or after her seizure activity. We have discussed her condition with Poison Control who states that gabapentin toxicity is relatively low risk, given that there are a set number of gabapentin receptors within the body and once they are saturated will produce no further symptoms before the medication is eliminated. Also, Poison Control suspects that her seizures was due to Anne [name] interaction with other medications and/or drugs. We will observe the patient for 24 hours. Psychiatry is involved.  2.  Overdose. The patient denies any suicidal ideation. She states that she was "trying to get back at her brother." We have suicidal precautions ordered and the patient will have a Comptroller.  3.  Depression. The patient is involuntarily committed. Psychiatry will be involved.  4.  Obesity. The patient's BMI is 33.7. We have encouraged a healthy diet and exercise.  5.  Deep vein thrombosis prophylaxis. Heparin.  6.  Gastrointestinal prophylaxis. None, as the patient is not  critically ill.   CODE STATUS: The patient is a full code.   TIME SPENT ON ADMISSION ORDERS AND PATIENT CARE: Approximately 35 minutes.   ____________________________ Kelton Pillar. Sheryle Hail, MD msd:bm D: 09/30/2014 05:27:34 ET T: 09/30/2014 05:58:33 ET JOB#: [id number]  cc: Kelton Pillar. Sheryle Hail, MD, <Dictator> Kelton Pillar Leilanee Righetti MD ELECTRONICALLY SIGNED 09/30/2014 7:42

## 2015-01-06 ENCOUNTER — Emergency Department: Payer: Self-pay

## 2015-01-06 ENCOUNTER — Other Ambulatory Visit: Payer: Self-pay

## 2015-01-06 ENCOUNTER — Emergency Department
Admission: EM | Admit: 2015-01-06 | Discharge: 2015-01-06 | Disposition: A | Discharge status: Home / Self Care | Payer: Self-pay | Attending: Emergency Medicine | Admitting: Emergency Medicine

## 2015-01-06 DIAGNOSIS — R0600 Dyspnea, unspecified: Secondary | ICD-10-CM | POA: Insufficient documentation

## 2015-01-06 DIAGNOSIS — F410 Panic disorder [episodic paroxysmal anxiety] without agoraphobia: Secondary | ICD-10-CM | POA: Insufficient documentation

## 2015-01-06 DIAGNOSIS — Z720 Tobacco use: Secondary | ICD-10-CM | POA: Insufficient documentation

## 2015-01-06 HISTORY — DX: Anxiety disorder, unspecified: F41.9

## 2015-01-06 HISTORY — DX: Gastro-esophageal reflux disease without esophagitis: K21.9

## 2015-01-06 LAB — CBC WITH DIFFERENTIAL/PLATELET
Basophils Absolute: 0.1 10*3/uL (ref 0–0.1)
Basophils Relative: 1 %
Eosinophils Absolute: 0.1 10*3/uL (ref 0–0.7)
Eosinophils Relative: 1 %
HEMATOCRIT: 46 % (ref 35.0–47.0)
Hemoglobin: 15.6 g/dL (ref 12.0–16.0)
LYMPHS ABS: 3.7 10*3/uL — AB (ref 1.0–3.6)
Lymphocytes Relative: 42 %
MCH: 31.4 pg (ref 26.0–34.0)
MCHC: 34 g/dL (ref 32.0–36.0)
MCV: 92.3 fL (ref 80.0–100.0)
MONOS PCT: 5 %
Monocytes Absolute: 0.4 10*3/uL (ref 0.2–0.9)
NEUTROS ABS: 4.5 10*3/uL (ref 1.4–6.5)
Neutrophils Relative %: 51 %
Platelets: 256 10*3/uL (ref 150–440)
RBC: 4.98 MIL/uL (ref 3.80–5.20)
RDW: 12.8 % (ref 11.5–14.5)
WBC: 8.8 10*3/uL (ref 3.6–11.0)

## 2015-01-06 LAB — BASIC METABOLIC PANEL
Anion gap: 12 (ref 5–15)
BUN: 14 mg/dL (ref 6–20)
CO2: 21 mmol/L — ABNORMAL LOW (ref 22–32)
CREATININE: 1 mg/dL (ref 0.44–1.00)
Calcium: 8.9 mg/dL (ref 8.9–10.3)
Chloride: 106 mmol/L (ref 101–111)
GFR calc non Af Amer: >60 mL/min (ref >60)
GLUCOSE: 99 mg/dL (ref 65–99)
POTASSIUM: 3.2 mmol/L — AB (ref 3.5–5.1)
SODIUM: 139 mmol/L (ref 135–145)

## 2015-01-06 LAB — TROPONIN I: Troponin I: <0.03 ng/mL (ref <0.031)

## 2015-01-06 MED ORDER — LORAZEPAM 2 MG/ML IJ SOLN
1.0000 mg | Freq: Once | INTRAMUSCULAR | Status: AC
  Administered 2015-01-06: 1 mg via INTRAVENOUS

## 2015-01-06 MED ORDER — SODIUM CHLORIDE 0.9 % IV BOLUS (SEPSIS)
1000.0000 mL | Freq: Once | INTRAVENOUS | Status: AC
  Administered 2015-01-06: 1000 mL via INTRAVENOUS

## 2015-01-06 MED ORDER — LORAZEPAM 2 MG/ML IJ SOLN
INTRAMUSCULAR | Status: AC
  Filled 2015-01-06: qty 1

## 2015-01-06 NOTE — ED Notes (Signed)
Pt presents to ed via EMS from home.  She reports that she donated plasma for the first time yesterday evening, when she went to bed at midnight she states that she felt much more tired than usual.  She states that she slept until 330 this afternoon and when she woke up she felt fatigued, SOB and anxious (hx of anxiety).

## 2015-01-06 NOTE — ED Provider Notes (Signed)
[facility] Emergency Department Provider Note  ____________________________________________  Time seen: Upon arrival to the emergency department  I have reviewed the triage vital signs and the nursing notes.   HISTORY  Chief Complaint Shortness of Breath    HPI Anne [name] is a 39 y.o. female with a history of recent donation of plasma yesterday who presents today with weakness and shortness of breath. She says that she had been feeling progressively weak over the past day with episodes of shortness of breath which worsened by stress. She says that she has a history of panic attacks but says she usually has chest heaviness with her panic attacks. She does not have any chest pain at this time. Denies any pain at all. Says that she feels "tingling" throughout her body. Says that she also lost a good friend several weeks ago and has had an increased amount of stress in her life. Says that she has never donated plasma in the past. Denies any deep pain with inspiration. No cough or fever at home.   History of GERD and panic attacks.  There are no active problems to display for this patient.   Surgical history of hysterectomy.  No current outpatient prescriptions on file.  Allergies Allergies to sulfa.  No family history on file.  Social History History  Substance Use Topics  . Smoking status: Current Every Day Smoker -- 0.50 packs/day for 20 years    Types: Cigarettes  . Smokeless tobacco: Not on file  . Alcohol Use: Yes     Comment: occasional    Review of Systems Constitutional: No fever/chills Eyes: No visual changes. ENT: No sore throat. Cardiovascular: Denies chest pain. Respiratory: As above Gastrointestinal: No abdominal pain.  No nausea, no vomiting.  No diarrhea.  No constipation. Genitourinary: Negative for dysuria. Musculoskeletal: Negative for back pain. Skin: Negative for rash. Neurological: Negative for headaches, focal  weakness or numbness. Psychiatric:Increased stress over the past several weeks. No suicidal or homicidal ideation. 10-point ROS otherwise negative.  ____________________________________________   PHYSICAL EXAM:  VITAL SIGNS: Vital signs reviewed  ED Triage Vitals  Enc Vitals Group     BP --      Pulse --      Resp --      Temp --      Temp src --      SpO2 --      Weight --      Height --      Head Cir --      Peak Flow --      Pain Score --      Pain Loc --      Pain Edu? --      Excl. in GC? --     Constitutional: Alert and oriented. Well appearing but tearful. Is able to be calmed when conversing.  Eyes: Conjunctivae are normal. PERRL. EOMI. Head: Atraumatic. Nose: No congestion/rhinnorhea. Mouth/Throat: Mucous membranes are moist.   Neck: No stridor.   Cardiovascular: Normal rate, regular rhythm. Grossly normal heart sounds.  Good peripheral circulation. Respiratory: Normal respiratory effort.  No retractions. Lungs CTAB. Gastrointestinal: Soft and nontender. No distention. No abdominal bruits. No CVA tenderness. Musculoskeletal: No lower extremity tenderness nor edema.  No joint effusions. Neurologic:  Normal speech and language. No gross focal neurologic deficits are appreciated. Speech is normal. No gait instability. Skin:  Skin is warm, dry and intact. No rash noted. Psychiatric: Mood and affect are normal. Speech and behavior are normal.  ____________________________________________   LABS (all labs ordered are listed, but only abnormal results are displayed)  Labs Reviewed  CBC WITH DIFFERENTIAL/PLATELET - Abnormal; Notable for the following:    Lymphs Abs 3.7 (*)    All other components within normal limits  BASIC METABOLIC PANEL - Abnormal; Notable for the following:    Potassium 3.2 (*)    CO2 21 (*)    All other components within normal limits  TROPONIN I   ____________________________________________  EKG  ED ECG REPORT I, Arelia Longest, the attending physician, personally viewed and interpreted this ECG.   Date: 01/06/2015  EKG Time: 1706  Rate: 93  Rhythm: normal sinus rhythm  Axis: Normal axis  Intervals:none  ST&T Change: Biphasic T waves in V2 and V3 which are unchanged from previous EKG from February 2016. No STEMI. EKG without any significant change from February 2016.  ____________________________________________  RADIOLOGY  No active disease on chest x-ray. ____________________________________________   PROCEDURES    ____________________________________________   INITIAL IMPRESSION / ASSESSMENT AND PLAN / ED COURSE  Pertinent labs & imaging results that were available during my care of the patient were reviewed by me and considered in my medical decision making (see chart for details).  ----------------------------------------- 5:51 PM on 01/06/2015 -----------------------------------------  Patient with relief of symptoms after fluids and Ativan. Labs and imaging a reassuring. I reassessed the patient's vitals personally and her respiratory rate is 18. She is still not tachycardic. Her respirations are not labored. Her temperature is 98.7. The patient is perk negative. We will discharge to home. Counseled about drinking and eating. Especially important that she drinks plenty of fluids stay hydrated after plasma donation. There also may be some component of stress response to this as the patient does have a history of panic attacks and the symptoms were at least partially alleviated with Ativan. ____________________________________________   FINAL CLINICAL IMPRESSION(S) / ED DIAGNOSES  Acute dyspnea. Acute panic attack. Initial visit.    Myrna Blazer, MD 01/06/15 [phone]

## 2015-02-01 ENCOUNTER — Ambulatory Visit: Payer: Self-pay | Attending: Oncology

## 2015-05-15 ENCOUNTER — Emergency Department
Admission: EM | Admit: 2015-05-15 | Discharge: 2015-05-15 | Disposition: A | Discharge status: Home / Self Care | Payer: Self-pay | Attending: Emergency Medicine | Admitting: Emergency Medicine

## 2015-05-15 ENCOUNTER — Emergency Department: Payer: Self-pay

## 2015-05-15 DIAGNOSIS — S81002A Unspecified open wound, left knee, initial encounter: Secondary | ICD-10-CM | POA: Insufficient documentation

## 2015-05-15 DIAGNOSIS — Y9389 Activity, other specified: Secondary | ICD-10-CM | POA: Insufficient documentation

## 2015-05-15 DIAGNOSIS — M25462 Effusion, left knee: Secondary | ICD-10-CM | POA: Insufficient documentation

## 2015-05-15 DIAGNOSIS — Q786 Multiple congenital exostoses: Secondary | ICD-10-CM | POA: Insufficient documentation

## 2015-05-15 DIAGNOSIS — W108XXA Fall (on) (from) other stairs and steps, initial encounter: Secondary | ICD-10-CM | POA: Insufficient documentation

## 2015-05-15 DIAGNOSIS — S50311A Abrasion of right elbow, initial encounter: Secondary | ICD-10-CM | POA: Insufficient documentation

## 2015-05-15 DIAGNOSIS — S5001XA Contusion of right elbow, initial encounter: Secondary | ICD-10-CM | POA: Insufficient documentation

## 2015-05-15 DIAGNOSIS — Y998 Other external cause status: Secondary | ICD-10-CM | POA: Insufficient documentation

## 2015-05-15 DIAGNOSIS — Y9289 Other specified places as the place of occurrence of the external cause: Secondary | ICD-10-CM | POA: Insufficient documentation

## 2015-05-15 DIAGNOSIS — Z720 Tobacco use: Secondary | ICD-10-CM | POA: Insufficient documentation

## 2015-05-15 DIAGNOSIS — M898X0 Other specified disorders of bone, multiple sites: Secondary | ICD-10-CM

## 2015-05-15 MED ORDER — NAPROXEN 500 MG PO TABS: 500.0000 mg | ORAL_TABLET | Freq: Two times a day (BID) | ORAL | Status: DC

## 2015-05-15 NOTE — ED Notes (Signed)
States she fell up steps yesterday afternoon  Landed on left knee.. Left knee swollen and tender to touch  Small laceration noted to knee  Bleeding controlled but area is draining clear fluid

## 2015-05-15 NOTE — ED Provider Notes (Signed)
CSN: [id number]     Arrival date & time 05/15/15  1330 History   First MD Initiated Contact with Patient 05/15/15 1409     Chief Complaint  Patient presents with  . Knee Pain     HPI Comments: 39 year old female presents today complaining of left knee injury that occurred yesterday. Pt reports tripping going up stairs yesterday and landing directly onto her left knee. Her brother is a [employer] fundrais[employer]edic and he closed the wound with steri strips and skin glue. However, she has continued to have a large amount of fluid leak from the knee anytime she bears weight. Is having moderate pain with weightbearing. Also hit her right elbow causing bruising and abrasions, but this is not bothering her. Tetanus is up to date.    Past Medical History  Diagnosis Date  . Anxiety   . GERD (gastroesophageal reflux disease)    Past Surgical History  Procedure Laterality Date  . Cholecystectomy    . Abdominal hysterectomy    . Tonsillectomy    . Nasal sinus surgery     No family history on file. Social History  Substance Use Topics  . Smoking status: Current Every Day Smoker -- 0.50 packs/day for 20 years    Types: Cigarettes  . Smokeless tobacco: Not on file  . Alcohol Use: Yes     Comment: occasional   OB History    No data available     Review of Systems  Musculoskeletal: Positive for joint swelling and arthralgias.  Skin: Positive for wound.  All other systems reviewed and are negative.     Allergies  Compazine; Phenergan; and Tape  Home Medications   Prior to Admission medications   Medication Sig Start Date End Date Taking? Authorizing Provider  naproxen (NAPROSYN) 500 MG tablet Take 1 tablet (500 mg total) by mouth 2 (two) times daily with a meal. 05/15/15   Wilber OliphantEmma Weavil V, PA-C   BP 118/84 mmHg  Pulse 86  Temp(Src) 98.3 F (36.8 C) (Oral)  Resp 18  Ht 5\' 3"  (1.6 m)  Wt 165 lb (74.844 kg)  BMI 29.24 kg/m2  SpO2 98% Physical Exam  Constitutional: She is oriented to person, place,  and time. Vital signs are normal. She appears well-developed and well-nourished. She is active.  Non-toxic appearance. She does not have a sickly appearance. She does not appear ill.  HENT:  Head: Normocephalic and atraumatic.  Cardiovascular: Intact distal pulses.   Musculoskeletal: She exhibits tenderness.       Left knee: She exhibits decreased range of motion, swelling, effusion and bony tenderness. She exhibits no deformity, normal alignment, no LCL laxity, normal meniscus and no MCL laxity. Tenderness found.       Legs: Ecchymosis and abrasions present, full ROM intact   Neurological: She is alert and oriented to person, place, and time.  Skin: Skin is warm and dry.  Psychiatric: She has a normal mood and affect. Her behavior is normal. Judgment and thought content normal.  Nursing note and vitals reviewed.   ED Course  Procedures (including critical care time) Labs Review Labs Reviewed - No data to display  Imaging Review Dg Knee Complete 4 Views Left  05/15/2015  CLINICAL DATA:  Larey SeatFell on stairs yesterday and landed on left knee with pain and swelling currently EXAM: LEFT KNEE - COMPLETE 4+ VIEW COMPARISON:  None. FINDINGS: 13 mm exostosis over the lateral femoral condyle. 4 mm exostosis immediately above this. Mild narrowing of the medial compartment. No  fracture or dislocation. Small joint effusion. Mild ossification of the inserting fibers of the quadriceps tendon. IMPRESSION: No acute osseous abnormality Small joint effusion Two lateral femoral exostoses. If these are related with any history of nontraumatic pain MRI would be suggested. Electronically Signed   By: Esperanza Heir M.D.   On: 05/15/2015 15:19   I have personally reviewed and evaluated these images and lab results as part of my medical decision-making.   EKG Interpretation None      MDM  Discussed with patient results of XRAY, advised her to follow up with Dr. Joice Lofts as soon as possible for further evaluation of  knee effusion and exostosis. I applied steri strips over open wound and then put a pressure dressing with gauze on it. Patient was also given crutches. Advised to elevated and continue using compression over the next 3 days until evaluated by ortho.  Final diagnoses:  Knee effusion, left  Open wound of knee, left, initial encounter  Exostoses, multiple        Luvenia Redden, PA-C 05/15/15 1635  Phineas Semen, MD 05/16/15 [phone]

## 2015-05-15 NOTE — Discharge Instructions (Signed)
You have a an exostosis on your femur, this is a small overgrowth of bone. You should have this further evaluated by an orthopedist.   Knee Effusion Knee effusion means that you have excess fluid in your knee joint. This can cause pain and swelling in your knee. This may make your knee more difficult to bend and move. That is because there is increased pain and pressure in the joint. If there is fluid in your knee, it often means that something is wrong inside your knee, such as severe arthritis, abnormal inflammation, or an infection. Another common cause of knee effusion is an injury to the knee muscles, ligaments, or cartilage. HOME CARE INSTRUCTIONS  Use crutches as directed by your health care provider.  Wear a knee brace as directed by your health care provider.  Apply ice to the swollen area:  Put ice in a plastic bag.  Place a towel between your skin and the bag.  Leave the ice on for 20 minutes, 2-3 times per day.  Keep your knee raised (elevated) when you are sitting or lying down.  Take medicines only as directed by your health care provider.  Do any rehabilitation or strengthening exercises as directed by your health care provider.  Rest your knee as directed by your health care provider. You may start doing your normal activities again when your health care provider approves.   Keep all follow-up visits as directed by your health care provider. This is important. SEEK MEDICAL CARE IF:  You have ongoing (persistent) pain in your knee. SEEK IMMEDIATE MEDICAL CARE IF:  You have increased swelling or redness of your knee.  You have severe pain in your knee.  You have a fever.   This information is not intended to replace advice given to you by your health care provider. Make sure you discuss any questions you have with your health care provider.   Document Released: 09/23/2003 Document Revised: 07/24/2014 Document Reviewed: 02/16/2014 Elsevier Interactive Patient  Education [employer]2016 [employer]

## 2015-05-15 NOTE — ED Notes (Signed)
Pt reports she tripped going up the step yesterday. Pt with pressure bandage to left knee. Pt also reports increased pain to left knee with ambulation

## 2015-07-14 ENCOUNTER — Encounter: Payer: Self-pay | Admitting: Medical Oncology

## 2015-07-14 ENCOUNTER — Emergency Department
Admission: EM | Admit: 2015-07-14 | Discharge: 2015-07-14 | Discharge status: Against Medical Advice | Payer: Self-pay | Attending: Emergency Medicine | Admitting: Emergency Medicine

## 2015-07-14 DIAGNOSIS — F17210 Nicotine dependence, cigarettes, uncomplicated: Secondary | ICD-10-CM | POA: Insufficient documentation

## 2015-07-14 DIAGNOSIS — K2970 Gastritis, unspecified, without bleeding: Secondary | ICD-10-CM | POA: Insufficient documentation

## 2015-07-14 DIAGNOSIS — F419 Anxiety disorder, unspecified: Secondary | ICD-10-CM | POA: Insufficient documentation

## 2015-07-14 DIAGNOSIS — Z791 Long term (current) use of non-steroidal anti-inflammatories (NSAID): Secondary | ICD-10-CM | POA: Insufficient documentation

## 2015-07-14 LAB — COMPREHENSIVE METABOLIC PANEL
ALT: 13 U/L — ABNORMAL LOW (ref 14–54)
ANION GAP: 6 (ref 5–15)
AST: 13 U/L — ABNORMAL LOW (ref 15–41)
Albumin: 4.3 g/dL (ref 3.5–5.0)
Alkaline Phosphatase: 70 U/L (ref 38–126)
BUN: 13 mg/dL (ref 6–20)
CHLORIDE: 106 mmol/L (ref 101–111)
CO2: 25 mmol/L (ref 22–32)
Calcium: 9 mg/dL (ref 8.9–10.3)
Creatinine, Ser: 1.1 mg/dL — ABNORMAL HIGH (ref 0.44–1.00)
Glucose, Bld: 98 mg/dL (ref 65–99)
POTASSIUM: 3.9 mmol/L (ref 3.5–5.1)
Sodium: 137 mmol/L (ref 135–145)
Total Bilirubin: 0.4 mg/dL (ref 0.3–1.2)
Total Protein: 7.6 g/dL (ref 6.5–8.1)

## 2015-07-14 LAB — URINALYSIS COMPLETE WITH MICROSCOPIC (ARMC ONLY)
Bacteria, UA: NONE SEEN
Glucose, UA: NEGATIVE mg/dL
HGB URINE DIPSTICK: NEGATIVE
KETONES UR: NEGATIVE mg/dL
LEUKOCYTES UA: NEGATIVE
Nitrite: NEGATIVE
PH: 5 (ref 5.0–8.0)
Protein, ur: NEGATIVE mg/dL
Specific Gravity, Urine: 1.02 (ref 1.005–1.030)

## 2015-07-14 LAB — CBC
HCT: 38.2 % (ref 35.0–47.0)
Hemoglobin: 12.5 g/dL (ref 12.0–16.0)
MCH: 30.1 pg (ref 26.0–34.0)
MCHC: 32.8 g/dL (ref 32.0–36.0)
MCV: 92 fL (ref 80.0–100.0)
PLATELETS: 190 10*3/uL (ref 150–440)
RBC: 4.16 MIL/uL (ref 3.80–5.20)
RDW: 13.2 % (ref 11.5–14.5)
WBC: 6.1 10*3/uL (ref 3.6–11.0)

## 2015-07-14 LAB — LIPASE, BLOOD: LIPASE: 39 U/L (ref 11–51)

## 2015-07-14 MED ORDER — GI COCKTAIL ~~LOC~~
30.0000 mL | Freq: Once | ORAL | Status: DC
  Filled 2015-07-14: qty 30

## 2015-07-14 MED ORDER — ONDANSETRON 8 MG PO TBDP
8.0000 mg | ORAL_TABLET | Freq: Once | ORAL | Status: DC
  Filled 2015-07-14: qty 1

## 2015-07-14 NOTE — ED Notes (Signed)
Presents with general abd pain for couple of days  Pos n/v  But noticed some blood in vomit today

## 2015-07-14 NOTE — ED Notes (Signed)
Patient left AMA.

## 2015-07-14 NOTE — ED Provider Notes (Signed)
[facility] Emergency Department Provider Note  ____________________________________________  Time seen: 7 PM  I have reviewed the triage vital signs and the nursing notes.   HISTORY  Chief Complaint Emesis    HPI Anne [name] is a 39 y.o. female who presents with complaints of epigastric burning sensation nausea and vomiting. She attributes this to her acid reflux. She has long history of GERD/gastritis and has been on PPIs without success. She has never seen a gastroenterologist that she does not have insurance. She denies fevers chills. She denies small amount of blood in her vomitus at one point today but not since then. Normal stools. No radiation of pain. She reports the pain in her epigastrium is severe and similar to past events.     Past Medical History  Diagnosis Date  . Anxiety   . GERD (gastroesophageal reflux disease)     There are no active problems to display for this patient.   Past Surgical History  Procedure Laterality Date  . Cholecystectomy    . Abdominal hysterectomy    . Tonsillectomy    . Nasal sinus surgery      Current Outpatient Rx  Name  Route  Sig  Dispense  Refill  . naproxen (NAPROSYN) 500 MG tablet   Oral   Take 1 tablet (500 mg total) by mouth 2 (two) times daily with a meal.   30 tablet   0     Allergies Compazine; Phenergan; and Tape  No family history on file.  Social History Social History  Substance Use Topics  . Smoking status: Current Every Day Smoker -- 0.50 packs/day for 20 years    Types: Cigarettes  . Smokeless tobacco: None  . Alcohol Use: Yes     Comment: occasional    Review of Systems  Constitutional: Negative for fever. Eyes: Negative for visual changes. ENT: Negative for sore throat Cardiovascular: Negative for chest pain. Respiratory: Negative for shortness of breath. Gastrointestinal: As above for epigastric pain Genitourinary: Negative for dysuria. Musculoskeletal:  Negative for back pain. Skin: Negative for rash. Neurological: No dizziness Psychiatric: Positive for anxiety    ____________________________________________   PHYSICAL EXAM:  VITAL SIGNS: ED Triage Vitals  Enc Vitals Group     BP 07/14/15 1734 130/79 mmHg     Pulse Rate 07/14/15 1734 90     Resp 07/14/15 1734 17     Temp 07/14/15 1734 98.6 F (37 C)     Temp Source 07/14/15 1734 Oral     SpO2 07/14/15 1734 99 %     Weight 07/14/15 1734 190 lb (86.183 kg)     Height 07/14/15 1734  (1.6 m)     Head Cir --      Peak Flow --      Pain Score 07/14/15 1735 10     Pain Loc --      Pain Edu? --      Excl. in GC? --      Constitutional: Alert and oriented. Well appearing and in no distress. Eyes: Conjunctivae are normal.  ENT   Head: Normocephalic and atraumatic.   Mouth/Throat: Mucous membranes are moist. Cardiovascular: Normal rate, regular rhythm. Normal and symmetric distal pulses are present in all extremities.  Respiratory: Normal respiratory effort without tachypnea nor retractions.  Gastrointestinal: Mild tenderness to the epigastrium. Negative Murphy's. No distention. There is no CVA tenderness. Genitourinary: deferred Musculoskeletal: Nontender with normal range of motion in all extremities.  Neurologic:  Normal speech and  language. No gross focal neurologic deficits are appreciated. Skin:  Skin is warm, dry and intact. No rash noted. Psychiatric: Mood and affect are normal. Patient exhibits appropriate insight and judgment.  ____________________________________________    LABS (pertinent positives/negatives)  Labs Reviewed  COMPREHENSIVE METABOLIC PANEL - Abnormal; Notable for the following:    Creatinine, Ser 1.10 (*)    AST 13 (*)    ALT 13 (*)    All other components within normal limits  LIPASE, BLOOD  CBC  URINALYSIS COMPLETEWITH MICROSCOPIC ([facility] ONLY)    ____________________________________________   EKG  ED ECG REPORT I,  Jene EveryKINNER, Arlind Klingerman, the attending physician, personally viewed and interpreted this ECG.  Date: 07/14/2015 EKG Time: 5:40 PM Rate: 78 Rhythm: normal sinus rhythm QRS Axis: normal Intervals: normal ST/T Wave abnormalities: normal Conduction Disutrbances: none Narrative Interpretation: unremarkable   ____________________________________________    RADIOLOGY I have personally reviewed any xrays that were ordered on this patient: None  ____________________________________________   PROCEDURES  Procedure(s) performed: none  Critical Care performed: none  ____________________________________________   INITIAL IMPRESSION / ASSESSMENT AND PLAN / ED COURSE  Pertinent labs & imaging results that were available during my care of the patient were reviewed by me and considered in my medical decision making (see chart for details).  Patient presents with epigastric discomfort for 2 days. She reports is similar to past episodes of GERD flareups versus gastritis. She is on Protonix 40 mg twice a day.  She reports she has never had improvement from Carafate either. Her lab work is unremarkable. History of present illness is most consistent with gastritis versus GERD. We will a GI cocktail and Zofran ODT.   ----------------------------------------- 7:38 PM on 07/14/2015 -----------------------------------------  Patient emerged out of her room very angrily and said that she was going to go somewhere where she would be taking care of. It is unclear why she became so frustrated as medications had been ordered and our conversation was appropriate and reasonable initially. She has decisional capacity  ____________________________________________   FINAL CLINICAL IMPRESSION(S) / ED DIAGNOSES  Final diagnoses:  Gastritis     Jene Everyobert Ramiya Delahunty, MD 07/14/15 [phone]

## 2015-07-14 NOTE — ED Notes (Signed)
Pt reports abd pain that began 2 days ago with vomiting, today pt reports vomiting blood

## 2015-07-14 NOTE — Discharge Instructions (Signed)

## 2016-01-21 ENCOUNTER — Emergency Department
Admission: EM | Admit: 2016-01-21 | Discharge: 2016-01-21 | Disposition: A | Discharge status: Home / Self Care | Payer: Self-pay | Attending: Student | Admitting: Student

## 2016-01-21 ENCOUNTER — Encounter: Payer: Self-pay | Admitting: Emergency Medicine

## 2016-01-21 DIAGNOSIS — Z8719 Personal history of other diseases of the digestive system: Secondary | ICD-10-CM | POA: Insufficient documentation

## 2016-01-21 DIAGNOSIS — F17210 Nicotine dependence, cigarettes, uncomplicated: Secondary | ICD-10-CM | POA: Insufficient documentation

## 2016-01-21 DIAGNOSIS — K047 Periapical abscess without sinus: Secondary | ICD-10-CM | POA: Insufficient documentation

## 2016-01-21 DIAGNOSIS — Z79899 Other long term (current) drug therapy: Secondary | ICD-10-CM | POA: Insufficient documentation

## 2016-01-21 MED ORDER — AMOXICILLIN 500 MG PO TABS: 500.0000 mg | ORAL_TABLET | Freq: Two times a day (BID) | ORAL | Status: DC

## 2016-01-21 MED ORDER — FLUCONAZOLE 150 MG PO TABS: 150.0000 mg | ORAL_TABLET | Freq: Every day | ORAL | Status: DC

## 2016-01-21 MED ORDER — LIDOCAINE VISCOUS 2 % MT SOLN: 20.0000 mL | OROMUCOSAL | Status: DC | PRN

## 2016-01-21 MED ORDER — HYDROCODONE-ACETAMINOPHEN 5-325 MG PO TABS: 1.0000 | ORAL_TABLET | ORAL | Status: DC | PRN

## 2016-01-21 MED ORDER — IBUPROFEN 800 MG PO TABS: 800.0000 mg | ORAL_TABLET | Freq: Three times a day (TID) | ORAL | Status: DC | PRN

## 2016-01-21 NOTE — ED Notes (Signed)
Reports "abscessed tooth".  Swelling noted to left jaw.  Drinking soda, NAD.

## 2016-01-21 NOTE — Discharge Instructions (Signed)
OPTIONS FOR DENTAL FOLLOW UP CARE ° °[place] Department of Health and Human Services - Local Safety Net Dental Clinics °http://www.ncdhhs.gov/dph/oralhealth/services/safetynetclinics.htm °  °[facility] (336-562-3123) ° °[facility] [place] (919-933-9087) ° °[facility] ([phone] ext [phone]) ° °[facility] ([phone]) ° °[facility] ([phone]) °This clinic caters to the indigent population and is on a lottery system. °Location: °[facility], [address], [place] °Clinic Hours: °Wednesdays from 6pm - 9pm, patients seen by a lottery system. °For dates, call or go to [email or web address].[facility].edu/[facility]/patients/Dental-[facility] °Services: °Cleanings, fillings and simple extractions. °Payment Options: °DENTAL WORK IS FREE OF CHARGE. Bring proof of income or support. °Best way to get seen: °Arrive at 5:15 pm - this is a lottery, NOT first come/first serve, so arriving earlier will not increase your chances of being seen. °  °  °[facility] °[phone] °Select option 1 for emergencies °  °Location: °[facility], [address], [place] °Clinic Hours: °No walk-ins accepted - call the day before to schedule an appointment. °Check in times are 9:30 am and 1:30 pm. °Services: °Simple extractions, temporary fillings, pulpectomy/pulp debridement, uncomplicated abscess drainage. °Payment Options: °PAYMENT IS DUE AT THE TIME OF SERVICE.  Fee is usually $100-200, additional surgical procedures (e.g. abscess drainage) may be extra. °Cash, checks, Visa/MasterCard accepted.  Can file Medicaid if patient is covered for dental - patient should call case worker to check. °No discount for [facility] patients. °Best way to get seen: °MUST call the day before and get onto the schedule. Can usually be seen the next 1-2 days. No walk-ins accepted. °  °  °[facility] °[phone] °   °Location: °[facility], [address], [place] °Clinic Hours: °M, W, Th, F 8am or 1:30pm, Tues 9a or 1:30 - first come/first served. °Services: °Simple extractions, temporary fillings, uncomplicated abscess drainage.  You do not need to be an [place] resident. °Payment Options: °PAYMENT IS DUE AT THE TIME OF SERVICE. °Dental insurance, otherwise sliding scale - bring proof of income or support. °Depending on income and treatment needed, cost is usually $50-200. °Best way to get seen: °Arrive early as it is first come/first served. °  °  °[facility] °[phone] °  °Location: °[address] °Clinic Hours: °Mon-Thu 8a-5p °Services: °Most basic dental services including extractions and fillings. °Payment Options: °PAYMENT IS DUE AT THE TIME OF SERVICE. °Sliding scale, up to 50% off - bring proof if income or support. °Medicaid with dental option accepted. °Best way to get seen: °Call to schedule an appointment, can usually be seen within 2 weeks OR they will try to see walk-ins - show up at 8a or 2p (you may have to wait). °  °  °[facility] °919-245-2435 °[place] RESIDENTS ONLY °  °Location: °[facility], [address], [place], [place] [address] °Clinic Hours: By appointment only. °Monday - Thursday 8am-5pm, Friday 8am-12pm °Services: Cleanings, fillings, extractions. °Payment Options: °PAYMENT IS DUE AT THE TIME OF SERVICE. °Cash, Visa or MasterCard. Sliding scale - $30 minimum per service. °Best way to get seen: °Come in to office, complete packet and make an appointment - need proof of income °or support monies for each household member and proof of [place] residence. °Usually takes about a month to get in. °  °  °[facility] °[phone] °  °Location: °[address]   [place] °Clinic Hours: Walk-in Urgent Care Dental Services are offered Monday-Friday  mornings only. °The numbers of emergencies accepted daily is limited to the number of °providers available. °Maximum 15 - Mondays, Wednesdays & Thursdays °Maximum 10 - Tuesdays & Fridays °Services: °You do not need to be a [place] resident to be seen for a dental emergency. °Emergencies are defined as pain, swelling, abnormal bleeding, or dental trauma. Walkins will receive x-rays if needed. °NOTE: Dental cleaning is not an emergency. °Payment Options: °PAYMENT IS DUE AT THE TIME OF SERVICE. °Minimum co-pay is $40.00 for uninsured patients. °Minimum co-pay is $3.00 for Medicaid with dental coverage. °Dental Insurance is accepted and must be presented at time of visit. °Medicare does not cover dental. °Forms of payment: Cash, credit card, checks. °Best way to get seen: °If not previously registered with the clinic, walk-in dental registration begins at 7:15 am and is on a first come/first serve basis. °If previously registered with the clinic, call to make an appointment. °  °  °[facility] °[phone] °[place] RESIDENTS ONLY °  °Location: °[address], [place], [place] °Clinic Hours: °Mon-Thu 10a-2p °Services: Extractions only! °Payment Options: °FREE (donations accepted) - bring proof of income or support °Best way to get seen: °Call and schedule an appointment OR come at 8am on the 1st Monday of every month (except for holidays) when it is first come/first served. °  °  °[facility] °[phone] °  °Location: °[address], [place] °Clinic Hours: °Friday mornings °Services, Payment Options, Best way to get seen: °Call for info ° ° °Dental Abscess °A dental abscess is a collection of pus in or around a tooth. °CAUSES °This condition is caused by a bacterial infection around the root of the tooth that involves the inner part of the tooth (pulp). It may result from: °· Severe tooth decay. °· Trauma to the tooth that allows bacteria to enter into the pulp, such as a broken or chipped  tooth. °· Severe gum disease around a tooth. °SYMPTOMS °Symptoms of this condition include: °· Severe pain in and around the infected tooth. °· Swelling and redness around the infected tooth, in the mouth, or in the face. °· Tenderness. °· Pus drainage. °· Bad breath. °· Bitter taste in the mouth. °· Difficulty swallowing. °· Difficulty opening the mouth. °· Nausea. °· Vomiting. °· Chills. °· Swollen neck glands. °· Fever. °DIAGNOSIS °This condition is diagnosed with examination of the infected tooth. During the exam, your dentist may tap on the infected tooth. Your dentist will also ask about your medical and dental history and may order X-rays. °TREATMENT °This condition is treated by eliminating the infection. This may be done with: °· Antibiotic medicine. °· A root canal. This may be performed to save the tooth. °· Pulling (extracting) the tooth. This may also involve draining the abscess. This is done if the tooth cannot be saved. °HOME CARE INSTRUCTIONS °· Take medicines only as directed by your dentist. °· If you were prescribed antibiotic medicine, finish all of it even if you start to feel better. °· Rinse your mouth (gargle) often with salt water to relieve pain or swelling. °· Do not drive or operate heavy machinery while taking pain medicine. °· Do not apply heat to the outside of your mouth. °· Keep all follow-up visits as directed by your dentist. This is important. °SEEK MEDICAL CARE IF: °· Your pain is worse and is not helped by medicine. °SEEK IMMEDIATE MEDICAL CARE IF: °·   You have a fever or chills. °· Your symptoms suddenly get worse. °· You have a very bad headache. °· You have problems breathing or swallowing. °· You have trouble opening your mouth. °· You have swelling in your neck or around your eye. °  °This information is not intended to replace advice given to you by your health care provider. Make sure you discuss any questions you have with your health care provider. °  °Document  Released: 07/03/2005 Document Revised: 11/17/2014 Document Reviewed: 06/30/2014 °Elsevier Interactive Patient Education ©2016 [employer] Inc[employer] ° °

## 2016-01-21 NOTE — ED Provider Notes (Signed)
[facility] Emergency Department Provider Note  ____________________________________________  Time seen: Approximately 10:39 AM  I have reviewed the triage vital signs and the nursing notes.   HISTORY  Chief Complaint Dental Pain    HPI Anne [name] is a 40 y.o. female presents for evaluation of an abscess to the left side. Patient states that she is unable to see a dentist for over 2 months secondary to lack of insurance. Her facial swelling and pain is a 10 over 10 nonradiating. No relief with over-the-counter medications.   Past Medical History  Diagnosis Date  . Anxiety   . GERD (gastroesophageal reflux disease)     There are no active problems to display for this patient.   Past Surgical History  Procedure Laterality Date  . Cholecystectomy    . Abdominal hysterectomy    . Tonsillectomy    . Nasal sinus surgery      Current Outpatient Rx  Name  Route  Sig  Dispense  Refill  . omeprazole (PRILOSEC) 40 MG capsule   Oral   Take 40 mg by mouth daily.         Marland Kitchen. amoxicillin (AMOXIL) 500 MG tablet   Oral   Take 1 tablet (500 mg total) by mouth 2 (two) times daily.   20 tablet   0   . fluconazole (DIFLUCAN) 150 MG tablet   Oral   Take 1 tablet (150 mg total) by mouth daily.   1 tablet   0   . HYDROcodone-acetaminophen (NORCO) 5-325 MG tablet   Oral   Take 1-2 tablets by mouth every 4 (four) hours as needed for moderate pain.   15 tablet   0   . ibuprofen (ADVIL,MOTRIN) 800 MG tablet   Oral   Take 1 tablet (800 mg total) by mouth every 8 (eight) hours as needed.   30 tablet   0   . lidocaine (XYLOCAINE) 2 % solution   Mouth/Throat   Use as directed 20 mLs in the mouth or throat as needed for mouth pain.   100 mL   0     Allergies Compazine; Phenergan; and Tape  No family history on file.  Social History Social History  Substance Use Topics  . Smoking status: Current Every Day Smoker -- 0.50 packs/day for 20  years    Types: Cigarettes  . Smokeless tobacco: None  . Alcohol Use: Yes     Comment: occasional    Review of Systems Constitutional: No fever/chills ENT: No sore throat. Positive for dental pain. Positive for facial swelling. Cardiovascular: Denies chest pain. Respiratory: Denies shortness of breath. Skin: Negative for rash. Neurological: Negative for headaches, focal weakness or numbness.  10-point ROS otherwise negative.  ____________________________________________   PHYSICAL EXAM: BP 133/88 mmHg  Pulse 88  Temp(Src) 98.4 F (36.9 C) (Oral)  Resp 16  Ht 5\' 3"  (1.6 m)  Wt 80.74 kg  BMI 31.54 kg/m2  SpO2 100%  VITAL SIGNS: ED Triage Vitals  Enc Vitals Group     BP --      Pulse --      Resp --      Temp --      Temp src --      SpO2 --      Weight --      Height --      Head Cir --      Peak Flow --      Pain Score 01/21/16 1030 9  Pain Loc --      Pain Edu? --      Excl. in GC? --     Constitutional: Alert and oriented. Well appearing and in no acute distress. Head: Atraumatic. Nose: No congestion/rhinnorhea. Mouth/Throat: Mucous membranes are moist.  Oropharynx non-erythematous.Severe dental caries with fractured tooth noted on the left upper molar. Neck: No stridor.  Full range of motion nontender Cardiovascular: Normal rate, regular rhythm. Grossly normal heart sounds.  Good peripheral circulation. Respiratory: Normal respiratory effort.  No retractions. Lungs CTAB. Musculoskeletal: No lower extremity tenderness nor edema.  No joint effusions. Neurologic:  Normal speech and language. No gross focal neurologic deficits are appreciated. No gait instability. Skin:  Skin is warm, dry and intact. No rash noted. Psychiatric: Mood and affect are normal. Speech and behavior are normal.  ____________________________________________   LABS (all labs ordered are listed, but only abnormal results are displayed)  Labs Reviewed - No data to  display ____________________________________________  EKG   ____________________________________________  RADIOLOGY   ____________________________________________   PROCEDURES  Procedure(s) performed: None  Critical Care performed: No  ____________________________________________   INITIAL IMPRESSION / ASSESSMENT AND PLAN / ED COURSE  Pertinent labs & imaging results that were available during my care of the patient were reviewed by me and considered in my medical decision making (see chart for details).  Acute dental abscess. Rx given for amoxicillin 500 mg 3 times a day, but again 5/325, Motrin 800 mg and viscous lidocaine. Distal local dental providers given. ____________________________________________   FINAL CLINICAL IMPRESSION(S) / ED DIAGNOSES  Final diagnoses:  Dental abscess     This chart was dictated using voice recognition software/Dragon. Despite best efforts to proofread, errors can occur which can change the meaning. Any change was purely unintentional.   Evangeline Dakinharles M Caprice Mccaffrey, PA-C 01/21/16 1053  Gayla DossEryka A Gayle, MD 01/21/16 [phone]

## 2016-07-23 ENCOUNTER — Emergency Department: Payer: Self-pay

## 2016-07-23 ENCOUNTER — Encounter: Payer: Self-pay | Admitting: Emergency Medicine

## 2016-07-23 ENCOUNTER — Emergency Department
Admission: EM | Admit: 2016-07-23 | Discharge: 2016-07-23 | Disposition: A | Discharge status: Home / Self Care | Payer: Self-pay | Attending: Emergency Medicine | Admitting: Emergency Medicine

## 2016-07-23 DIAGNOSIS — R0602 Shortness of breath: Secondary | ICD-10-CM | POA: Insufficient documentation

## 2016-07-23 DIAGNOSIS — R042 Hemoptysis: Secondary | ICD-10-CM | POA: Insufficient documentation

## 2016-07-23 DIAGNOSIS — F17210 Nicotine dependence, cigarettes, uncomplicated: Secondary | ICD-10-CM | POA: Insufficient documentation

## 2016-07-23 DIAGNOSIS — Z79899 Other long term (current) drug therapy: Secondary | ICD-10-CM | POA: Insufficient documentation

## 2016-07-23 HISTORY — DX: Laceration without foreign body of pharynx and cervical esophagus, initial encounter: S11.21XA

## 2016-07-23 LAB — COMPREHENSIVE METABOLIC PANEL
ALBUMIN: 3.6 g/dL (ref 3.5–5.0)
ALK PHOS: 64 U/L (ref 38–126)
ALT: 11 U/L — ABNORMAL LOW (ref 14–54)
AST: 15 U/L (ref 15–41)
Anion gap: 6 (ref 5–15)
BUN: 19 mg/dL (ref 6–20)
CALCIUM: 8.8 mg/dL — AB (ref 8.9–10.3)
CO2: 25 mmol/L (ref 22–32)
CREATININE: 0.86 mg/dL (ref 0.44–1.00)
Chloride: 109 mmol/L (ref 101–111)
GFR calc Af Amer: >60 mL/min (ref >60)
GFR calc non Af Amer: >60 mL/min (ref >60)
GLUCOSE: 83 mg/dL (ref 65–99)
Potassium: 3.6 mmol/L (ref 3.5–5.1)
SODIUM: 140 mmol/L (ref 135–145)
Total Bilirubin: 0.2 mg/dL — ABNORMAL LOW (ref 0.3–1.2)
Total Protein: 7 g/dL (ref 6.5–8.1)

## 2016-07-23 LAB — CBC WITH DIFFERENTIAL/PLATELET
BASOS PCT: 1 %
Basophils Absolute: 0.1 10*3/uL (ref 0–0.1)
EOS ABS: 0.1 10*3/uL (ref 0–0.7)
Eosinophils Relative: 2 %
HCT: 38.4 % (ref 35.0–47.0)
Hemoglobin: 13.5 g/dL (ref 12.0–16.0)
Lymphocytes Relative: 37 %
Lymphs Abs: 2.9 10*3/uL (ref 1.0–3.6)
MCH: 32.7 pg (ref 26.0–34.0)
MCHC: 35.1 g/dL (ref 32.0–36.0)
MCV: 93.3 fL (ref 80.0–100.0)
MONO ABS: 0.3 10*3/uL (ref 0.2–0.9)
MONOS PCT: 4 %
Neutro Abs: 4.3 10*3/uL (ref 1.4–6.5)
Neutrophils Relative %: 56 %
Platelets: 220 10*3/uL (ref 150–440)
RBC: 4.12 MIL/uL (ref 3.80–5.20)
RDW: 12.9 % (ref 11.5–14.5)
WBC: 7.6 10*3/uL (ref 3.6–11.0)

## 2016-07-23 LAB — PROTIME-INR
INR: 0.92
PROTHROMBIN TIME: 12.4 s (ref 11.4–15.2)

## 2016-07-23 LAB — TROPONIN I

## 2016-07-23 MED ORDER — IOPAMIDOL (ISOVUE-370) INJECTION 76%
75.0000 mL | Freq: Once | INTRAVENOUS | Status: AC | PRN
  Administered 2016-07-23: 75 mL via INTRAVENOUS

## 2016-07-23 MED ORDER — ALBUTEROL SULFATE HFA 108 (90 BASE) MCG/ACT IN AERS: 2.0000 | INHALATION_SPRAY | Freq: Four times a day (QID) | RESPIRATORY_TRACT | 0 refills | Status: DC | PRN

## 2016-07-23 NOTE — ED Provider Notes (Signed)
[facility] Emergency Department Provider Note ____________________________________________   I have reviewed the triage vital signs and the triage nursing note.  HISTORY  Chief Complaint Hemoptysis   Historian Patient  HPI Anne [name] is a 41 y.o. female presents with complaint of intermittent shortness of breath and cough with productive bloody sputum, on and off for months - reports daily for a few weeks now. She states wakes up with taste of blood and small amount of dried blood in her mouth.  She feels that the blood is coming from cough/respiratory rather than emesis/GI.  Has a history of Gerd, on nexium, but feels the blood she's bringing up is from coughing.  No fever.  She is a smoker, but reports decreasing the amount.  No history diagnosed asthma and does not use inhalers.  No abdominal pain, nausea or vomiting.  She does have a primary care doctor, however she is a single mom who works and is unable to go during office hours.    Past Medical History:  Diagnosis Date  . Anxiety   . Esophageal tear   . GERD (gastroesophageal reflux disease)     There are no active problems to display for this patient.   Past Surgical History:  Procedure Laterality Date  . ABDOMINAL HYSTERECTOMY    . CHOLECYSTECTOMY    . NASAL SINUS SURGERY    . TONSILLECTOMY      Prior to Admission medications   Medication Sig Start Date End Date Taking? Authorizing Provider  cetirizine (ZYRTEC) 10 MG tablet Take 10 mg by mouth daily.   Yes Historical Provider, MD  esomeprazole (NEXIUM) 20 MG capsule Take 20 mg by mouth daily at 12 noon.   Yes Historical Provider, MD  multivitamin (ONE-A-DAY MEN'S) TABS tablet Take 1 tablet by mouth daily.   Yes Historical Provider, MD    Allergies  Allergen Reactions  . Ondansetron Other (See Comments)    Altered mental status  . Compazine [Prochlorperazine Edisylate] Other (See Comments)    Altered mental status  .  Phenergan [Promethazine Hcl] Other (See Comments)    Altered mental status  . Tape Rash    No family history on file.  Social History Social History  Substance Use Topics  . Smoking status: Current Every Day Smoker    Packs/day: 0.50    Years: 20.00    Types: Cigarettes  . Smokeless tobacco: Never Used  . Alcohol use Yes     Comment: occasional    Review of Systems  Constitutional: Negative for fever. Eyes: Negative for visual changes. ENT: Negative for sore throat. Cardiovascular: Negative for chest pain. Respiratory: Positive for intermittent coughing and shortness of breath as per history of present illness.  Gastrointestinal: Negative for abdominal pain, vomiting and diarrhea. Genitourinary: Negative for dysuria. Musculoskeletal: Negative for back pain. Skin: Negative for rash. Neurological: Negative for headache. 10 point Review of Systems otherwise negative ____________________________________________   PHYSICAL EXAM:  VITAL SIGNS: ED Triage Vitals [07/23/16 1112]  Enc Vitals Group     BP 115/89     Pulse Rate 86     Resp 16     Temp 98.5 F (36.9 C)     Temp Source Oral     SpO2 100 %     Weight 175 lb (79.4 kg)     Height 5\' 3"  (1.6 m)     Head Circumference      Peak Flow      Pain Score  Pain Loc      Pain Edu?      Excl. in GC?      Constitutional: Alert and oriented. Well appearing and in no distress. HEENT   Head: Normocephalic and atraumatic.      Eyes: Conjunctivae are normal. PERRL. Normal extraocular movements.      Ears:         Nose: No congestion/rhinnorhea.   Mouth/Throat: Mucous membranes are moist.   Neck: No stridor. Cardiovascular/Chest: Normal rate, regular rhythm.  No murmurs, rubs, or gallops. Respiratory: Normal respiratory effort without tachypnea nor retractions. Breath sounds are clear and equal bilaterally. No wheezes/rales/rhonchi. Gastrointestinal: Soft. No distention, no guarding, no rebound.  Nontender.    Genitourinary/rectal:Deferred Musculoskeletal: Nontender with normal range of motion in all extremities. No joint effusions.  No lower extremity tenderness.  No edema. Neurologic:  Normal speech and language. No gross or focal neurologic deficits are appreciated. Skin:  Skin is warm, dry and intact. No rash noted. Psychiatric: Mood and affect are normal. Speech and behavior are normal. Patient exhibits appropriate insight and judgment.   ____________________________________________  LABS (pertinent positives/negatives)  Labs Reviewed  COMPREHENSIVE METABOLIC PANEL - Abnormal; Notable for the following:       Result Value   Calcium 8.8 (*)    ALT 11 (*)    Total Bilirubin 0.2 (*)    All other components within normal limits  CBC WITH DIFFERENTIAL/PLATELET  TROPONIN I  PROTIME-INR    ____________________________________________    EKG I, Governor Rooks, MD, the attending physician have personally viewed and interpreted all ECGs.  62 bpm. Normal sinus rhythm. Narrow QRS. Normal axis. Normal ST and T-wave ____________________________________________  RADIOLOGY All Xrays were viewed by me. Imaging interpreted by Radiologist.  Chest x-ray two-view:  No radiographic evidence of acute cardiopulmonary disease.  CT chest for PE:  IMPRESSION: 1. No evidence of pulmonary embolism. 2. Subtle pattern of centrilobular ground-glass attenuation micronodularity most evident throughout the mid to upper lungs, concerning for smoking related disease such as respiratory bronchiolitis (RB), or given the patient's symptomatology, respiratory bronchiolitis-interstitial lung disease (RB-ILD). In addition, there is some very mild patchy ground-glass attenuation throughout the mid to lower lungs bilaterally which could also be smoking related and can be seen in the setting of desquamative interstitial pneumonia (DIP). Counseling for smoking cessation is strongly  recommended. __________________________________________  PROCEDURES  Procedure(s) performed: None  Critical Care performed: None  ____________________________________________   ED COURSE / ASSESSMENT AND PLAN  Pertinent labs & imaging results that were available during my care of the patient were reviewed by me and considered in my medical decision making (see chart for details).   Ms. Desrocher is overall well appearing, but is reporting ongoing episodes of cough with hemoptysis, really over several months but seems a little bit more pronounced over the last several weeks. She is not hypoxic. She denies any pleuritic chest pain, however given chest x-ray is clear, I am going to recommend chest CT for further evaluation.  We did have a pre-significant discussion about whether or not this was potentially GI bleeding/hematemesis and she does not feel like this is hematemesis but in fact hemoptysis.  Laboratory studies do not indicate a significant anemia, nor are any of her coagulopathy study is abnormal today.   CT scan shows no PE and no acute findings. Her CT does show some likely chronic interstitial lung disease.  I spoke with intensivist Dr. Nicholos Johns, did not recommend any additional emergency or hospital workup, but  recommended outpatient follow-up and pulmonology consultation.  This patient was fairly concerned based on a research about the possibility for TB, there are no x-ray or CT findings concerning for this. Additional workup may be considered with a primary care doctor or pulmonologist. She was also concerned about pertussis, I will go ahead and send the PCR testing for this based on what she describes as her paroxysmal cough, but this is very unlikely.    CONSULTATIONS:   None   Patient / Family / Caregiver informed of clinical course, medical decision-making process, and agree with plan.   I discussed return precautions, follow-up instructions, and discharge  instructions with patient and/or family.   ___________________________________________   FINAL CLINICAL IMPRESSION(S) / ED DIAGNOSES   Final diagnoses:  Hemoptysis              Note: This dictation was prepared with Dragon dictation. Any transcriptional errors that result from this process are unintentional    Governor Rooks, MD 07/23/16 1531

## 2016-07-23 NOTE — ED Triage Notes (Signed)
Arrives today C/O hemoptysis for "a little over a year".  Initially patient had an esophageal tear.  Patient has history of GERD.  States for past 2-3 months patient has had daily episodes of hemoptysis.  Patient has been taking mucinex.

## 2016-07-23 NOTE — Discharge Instructions (Signed)
You were evaluated for several months of coughing up blood, and your exam and evaluation are reassuring in the emergency department today. As we discussed, your CT scan shows evidence of possible pulmonary fibrosis, and you should follow up with a pulmonologist.  You are being prescribed an albuterol inhaler to help with episodes of shortness of breath which I suspect may be due to wheezing or bronchospasm.  You may discuss further TB testing potentially with your primary care doctor, although your x-ray and CAT scan were reassuring. The pertussis or whooping cough test was sent, and results will not be back for a while, but your primary care doctor could follow up on these results.  Return to the emergency room for any worsening condition including any heavier bleeding, black or bloody stool, abdominal pain, vomiting blood, new or worsening chest pain or trouble breathing, or any other symptoms concerning to you.

## 2016-07-23 NOTE — ED Notes (Signed)

## 2016-07-23 NOTE — ED Notes (Signed)
Patient transported to X-ray 

## 2016-07-24 LAB — BORDETELLA PERTUSSIS PCR
B PERTUSSIS, DNA: NEGATIVE
B parapertussis, DNA: NEGATIVE

## 2017-01-01 ENCOUNTER — Emergency Department: Payer: Self-pay

## 2017-01-01 ENCOUNTER — Encounter: Payer: Self-pay | Admitting: Emergency Medicine

## 2017-01-01 ENCOUNTER — Other Ambulatory Visit: Payer: Self-pay

## 2017-01-01 ENCOUNTER — Emergency Department
Admission: EM | Admit: 2017-01-01 | Discharge: 2017-01-01 | Disposition: A | Discharge status: Home / Self Care | Payer: Self-pay | Attending: Emergency Medicine | Admitting: Emergency Medicine

## 2017-01-01 DIAGNOSIS — R52 Pain, unspecified: Secondary | ICD-10-CM

## 2017-01-01 DIAGNOSIS — S20212A Contusion of left front wall of thorax, initial encounter: Secondary | ICD-10-CM | POA: Insufficient documentation

## 2017-01-01 DIAGNOSIS — Y929 Unspecified place or not applicable: Secondary | ICD-10-CM | POA: Insufficient documentation

## 2017-01-01 DIAGNOSIS — Y998 Other external cause status: Secondary | ICD-10-CM | POA: Insufficient documentation

## 2017-01-01 DIAGNOSIS — X509XXA Other and unspecified overexertion or strenuous movements or postures, initial encounter: Secondary | ICD-10-CM | POA: Insufficient documentation

## 2017-01-01 DIAGNOSIS — S40012A Contusion of left shoulder, initial encounter: Secondary | ICD-10-CM | POA: Insufficient documentation

## 2017-01-01 DIAGNOSIS — F17210 Nicotine dependence, cigarettes, uncomplicated: Secondary | ICD-10-CM | POA: Insufficient documentation

## 2017-01-01 DIAGNOSIS — Y9318 Activity, surfing, windsurfing and boogie boarding: Secondary | ICD-10-CM | POA: Insufficient documentation

## 2017-01-01 MED ORDER — IBUPROFEN 600 MG PO TABS: 600.0000 mg | ORAL_TABLET | Freq: Three times a day (TID) | ORAL | 0 refills | Status: DC | PRN

## 2017-01-01 MED ORDER — OXYCODONE-ACETAMINOPHEN 5-325 MG PO TABS
1.0000 | ORAL_TABLET | Freq: Once | ORAL | Status: AC
  Administered 2017-01-01: 1 via ORAL
  Filled 2017-01-01: qty 1

## 2017-01-01 MED ORDER — METHOCARBAMOL 500 MG PO TABS: ORAL_TABLET | ORAL | 0 refills | Status: DC

## 2017-01-01 MED ORDER — METHOCARBAMOL 500 MG PO TABS
1000.0000 mg | ORAL_TABLET | Freq: Once | ORAL | Status: AC
  Administered 2017-01-01: 1000 mg via ORAL
  Filled 2017-01-01: qty 2

## 2017-01-01 NOTE — ED Triage Notes (Signed)
Patient presents to the ED with right breast and rib pain since yesterday after going down a water slide.  Patient states, "I got all turned around funny on the water slide and now it really hurts to breathe."  Patient is holding her right breast and appears uncomfortable.

## 2017-01-01 NOTE — ED Provider Notes (Signed)
[facility] Emergency Department Provider Note   ____________________________________________   First MD Initiated Contact with Patient 01/01/17 1051     (approximate)  I have reviewed the triage vital signs and the nursing notes.   HISTORY  Chief Complaint Chest Pain    HPI Anne [name] is a 41 y.o. female is here with complaint of left shoulder pain, left rib pain, and chest pain after sliding down a water slide yesterday. She denies any head injury or loss of consciousness. She states that she believes that she twisted while she was going down the slide. She states that to take a deep breath hurts. She also has some soreness in her right breast. She has taken some over-the-counter medication without any relief of her pain. Currently she rates her pain as 10 over 10.   Past Medical History:  Diagnosis Date  . Anxiety   . Esophageal tear   . GERD (gastroesophageal reflux disease)     There are no active problems to display for this patient.   Past Surgical History:  Procedure Laterality Date  . ABDOMINAL HYSTERECTOMY    . CHOLECYSTECTOMY    . NASAL SINUS SURGERY    . TONSILLECTOMY      Prior to Admission medications   Medication Sig Start Date End Date Taking? Authorizing Provider  albuterol (PROVENTIL HFA;VENTOLIN HFA) 108 (90 Base) MCG/ACT inhaler Inhale 2 puffs into the lungs every 6 (six) hours as needed for wheezing or shortness of breath. 07/23/16   Governor Rooks, MD  cetirizine (ZYRTEC) 10 MG tablet Take 10 mg by mouth daily.    [provider]  esomeprazole (NEXIUM) 20 MG capsule Take 20 mg by mouth daily at 12 noon.    [provider]  ibuprofen (ADVIL,MOTRIN) 600 MG tablet Take 1 tablet (600 mg total) by mouth every 8 (eight) hours as needed. 01/01/17   Tommi Rumps, PA-C  methocarbamol (ROBAXIN) 500 MG tablet 1-2 tablet qid prn muscles 01/01/17   Tommi Rumps, PA-C  multivitamin (ONE-A-DAY MEN'S) TABS  tablet Take 1 tablet by mouth daily.    [provider]    Allergies Ondansetron; Compazine [prochlorperazine edisylate]; Phenergan [promethazine hcl]; and Tape  No family history on file.  Social History Social History  Substance Use Topics  . Smoking status: Current Every Day Smoker    Packs/day: 0.50    Years: 20.00    Types: Cigarettes  . Smokeless tobacco: Never Used  . Alcohol use Yes     Comment: occasional    Review of Systems  Constitutional: No fever/chills Eyes: No visual changes. ENT: No trauma Cardiovascular: Denies chest pain. Respiratory: Denies shortness of breath. Gastrointestinal: No abdominal pain.  No nausea, no vomiting.  Musculoskeletal: Positive for left rib pain, left shoulder pain, left chest pain. Skin: Negative for injury. Neurological: Negative for headaches, focal weakness or numbness.   ____________________________________________   PHYSICAL EXAM:  VITAL SIGNS: ED Triage Vitals  Enc Vitals Group     BP 01/01/17 0935 98/78     Pulse Rate 01/01/17 0935 85     Resp 01/01/17 0935 18     Temp 01/01/17 0935 97.6 F (36.4 C)     Temp Source 01/01/17 0935 Oral     SpO2 01/01/17 0935 98 %     Weight 01/01/17 0925 180 lb (81.6 kg)     Height 01/01/17 0925 5\' 3"  (1.6 m)     Head Circumference --  Peak Flow --      Pain Score 01/01/17 0925 10     Pain Loc --      Pain Edu? --      Excl. in GC? --     Constitutional: Alert and oriented. Well appearing and in no acute distress. Eyes: Conjunctivae are normal. PERRL. EOMI. Head: Atraumatic. Nose: No injury Neck: No stridor. No cervical tenderness on palpation posteriorly. Range of motion is without restriction. Cardiovascular: Normal rate, regular rhythm. Grossly normal heart sounds.  Good peripheral circulation. Respiratory: Normal respiratory effort.  No retractions. Lungs CTAB. Gastrointestinal: Soft and nontender. No distention.  Musculoskeletal: On examination there is  no gross deformity however there is marked tenderness on palpation of the left shoulder both anterior and posterior. There is no soft tissue swelling or abrasions noted. No ecchymosis. Range of motion is restricted secondary to patient's pain. There is also moderate amount of tenderness on palpation of the left ribs without any gross deformity. No soft tissue swelling and no ecchymosis was noted. There is some minimal tenderness on palpation of the left anterior chest. No sternal or right sided chest pain noted. Neurologic:  Normal speech and language. No gross focal neurologic deficits are appreciated. No gait instability. Skin:  Skin is warm, dry and intact. No ecchymosis, erythema or abrasions were noted. Psychiatric: Mood and affect are normal. Speech and behavior are normal.  ____________________________________________   LABS (all labs ordered are listed, but only abnormal results are displayed)  Labs Reviewed - No data to display ____________________________________________  EKG  EKG done in triage shows normal sinus rhythm with a ventricular rate of 81. PR interval 124, QRS duration 84. EKG was reviewed by Dr.  Tyrone Apple ____________________________________________  RADIOLOGY  Dg Chest 2 View  Result Date: 01/01/2017 CLINICAL DATA:  Chest pain, shortness of Breath EXAM: CHEST  2 VIEW COMPARISON:  07/23/2016 FINDINGS: Linear atelectasis in the lung bases. No confluent opacities otherwise. Heart is normal size. No effusions or acute bony abnormality. IMPRESSION: Linear bibasilar atelectasis. Electronically Signed   By: Charlett Nose M.D.   On: 01/01/2017 10:09   Dg Ribs Unilateral Left  Result Date: 01/01/2017 CLINICAL DATA:  Patient presents to the ED with right breast and rib anterior rib pain since yesterday after going down a water slide. EXAM: LEFT RIBS - 2 VIEW COMPARISON:  None. FINDINGS: No fracture or other bone lesions are seen involving the ribs. IMPRESSION: No acute  osseous injury of the left ribs. Electronically Signed   By: Elige Ko   On: 01/01/2017 11:43   Dg Shoulder Left  Result Date: 01/01/2017 CLINICAL DATA:  Pain.  Possible injury. EXAM: LEFT SHOULDER - 2+ VIEW COMPARISON:  CT 07/23/2016. FINDINGS: No acute bony or joint abnormality identified. No evidence of fracture or dislocation. IMPRESSION: No acute or focal abnormality. Electronically Signed   By: Maisie Fus  Register   On: 01/01/2017 11:42    ____________________________________________   PROCEDURES  Procedure(s) performed: None  Procedures  Critical Care performed: No  ____________________________________________   INITIAL IMPRESSION / ASSESSMENT AND PLAN / ED COURSE  Pertinent labs & imaging results that were available during my care of the patient were reviewed by me and considered in my medical decision making (see chart for details).  Patient was given Percocet while in the department prior to her x-rays. 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 She requested something stronger to be given to her prior to her discharge but not a prescription written. Patient was made aware that she would receive a prescription for Robaxin which is a muscle relaxant. She is also given a prescription for ibuprofen 600 mg 3 times a day with food. She is encouraged to use ice to her shoulder and ribs. She is also placed in a sling. She is to follow-up with her PCP if any continued problems.      ____________________________________________   FINAL CLINICAL IMPRESSION(S) / ED DIAGNOSES  Final diagnoses:  Contusion of multiple sites of left shoulder, initial encounter  Contusion of ribs, left, initial encounter  Contusion, chest wall, left, initial encounter      NEW  MEDICATIONS STARTED DURING THIS VISIT:  Discharge Medication List as of 01/01/2017 12:02 PM    START taking these medications   Details  ibuprofen (ADVIL,MOTRIN) 600 MG tablet Take 1 tablet (600 mg total) by mouth every 8 (eight) hours as needed., Starting Mon 01/01/2017, Print    methocarbamol (ROBAXIN) 500 MG tablet 1-2 tablet qid prn muscles, Print         Note:  This document was prepared using Dragon voice recognition software and may include unintentional dictation errors.    Tommi RumpsSummers, Phebe Dettmer L, PA-C 01/01/17 1511    Minna AntisPaduchowski, Kevin, MD 01/02/17 [phone]

## 2017-01-01 NOTE — ED Notes (Signed)
See triage note  States she was going down a water slide yesterday  Got twisted up  Having pain to left upper chest and shoulder area    No relief with OTC meds  Increased pain with movement and inspiration

## 2017-01-01 NOTE — Discharge Instructions (Signed)
Follow-up with your primary care doctor if any continued problems. Begin taking Robaxin as directed and ibuprofen as needed for pain and inflammation. You may use ice to your chest and shoulder as needed for pain.

## 2017-04-04 ENCOUNTER — Ambulatory Visit: Payer: Self-pay | Attending: Oncology

## 2017-08-27 ENCOUNTER — Encounter (INDEPENDENT_AMBULATORY_CARE_PROVIDER_SITE_OTHER): Payer: Self-pay

## 2017-08-27 ENCOUNTER — Ambulatory Visit: Payer: Self-pay | Attending: Oncology | Admitting: *Deleted

## 2017-08-27 VITALS — BP 108/73 | HR 87 | Temp 98.1°F | Ht 66.0 in | Wt 187.0 lb

## 2017-08-27 DIAGNOSIS — N63 Unspecified lump in unspecified breast: Secondary | ICD-10-CM

## 2017-08-27 NOTE — Progress Notes (Signed)
Subjective:     Patient ID: Anne [name], female   DOB: Mar 26, 1976, 42 y.o.   MRN: [id number]  HPI   Review of Systems     Objective:   Physical Exam  Pulmonary/Chest: Right breast exhibits mass and nipple discharge. Right breast exhibits no inverted nipple, no skin change and no tenderness. Left breast exhibits no inverted nipple, no mass, no nipple discharge, no skin change and no tenderness. Breasts are symmetrical.         Assessment:     42 year old White female presents with complaints of a right breast mass and right nipple discharge.  States she has a right breast mass since June of 2018.  States it feels like it is getting larger.  She also states over the last week she has noticed "yellow" spontaneous right nipple discharge.  On clinical breast exam bilateral breast are very large and pendulous.  I can palpate diffuse fibroglandular tissue bilateral.  The patient states she can feel the area of concern more in a "sitting position".  Had the patient sit and she pointed to the area of concern at 1:00 right breast approximately 10 cm from the areola.  I can feel an approximate 2 cm smooth, soft glandular like tissue.  There is no discharge noted on exam today.  Patient does have a significant family history of breast cancer in the following: maternal grandmother in her 1970's or 5880's, maternal great aunt with breast cancer and 2 maternal aunts with breast cancer diagnosed in their 4330's or 2340's.  She does not know if anyone had genetic testing.  Encouraged to ask her family if anyone was tested.  Patient has been screened for eligibility.  She does not have any insurance, Medicare or Medicaid.  She also meets financial eligibility.  Hand-out given on the Affordable Care Act.    Plan:     Bilateral diagnostic mammogram and ultrasound ordered.  Will follow-up per [facility] protocol.

## 2017-08-27 NOTE — Patient Instructions (Signed)
Gave patient hand-out, Women Staying Healthy, Active and Well from [facility], with education on breast health, pap smears, heart and colon health. 

## 2017-08-30 ENCOUNTER — Other Ambulatory Visit: Payer: Self-pay

## 2017-09-06 ENCOUNTER — Other Ambulatory Visit: Payer: Self-pay

## 2017-09-07 ENCOUNTER — Telehealth: Payer: Self-pay | Admitting: *Deleted

## 2017-09-07 NOTE — Telephone Encounter (Signed)
Called patient to follow-up on her mammogram.  She has cancelled the last 2 appointments.  Patient states she has the flu.  Transferred the call to scheduling department in the breast center to reschedule patients diagnostic mammogram.

## 2017-09-18 ENCOUNTER — Other Ambulatory Visit: Payer: Self-pay

## 2017-10-09 ENCOUNTER — Encounter: Payer: Self-pay | Admitting: *Deleted

## 2017-10-09 NOTE — Progress Notes (Signed)
Certified letter mailed to inform patient of the importance of getting her mammogram.  She has cancelled her appointment twice, and no-showed once.  Currently there is no appointment scheduled for her. If no response will close case as refusal to follow-up.

## 2017-10-16 ENCOUNTER — Encounter: Payer: Self-pay | Admitting: Emergency Medicine

## 2017-10-16 ENCOUNTER — Emergency Department: Payer: Self-pay

## 2017-10-16 ENCOUNTER — Emergency Department
Admission: EM | Admit: 2017-10-16 | Discharge: 2017-10-16 | Disposition: A | Discharge status: Home / Self Care | Payer: Self-pay | Attending: Emergency Medicine | Admitting: Emergency Medicine

## 2017-10-16 DIAGNOSIS — Y998 Other external cause status: Secondary | ICD-10-CM | POA: Insufficient documentation

## 2017-10-16 DIAGNOSIS — S6391XA Sprain of unspecified part of right wrist and hand, initial encounter: Secondary | ICD-10-CM | POA: Insufficient documentation

## 2017-10-16 DIAGNOSIS — Y929 Unspecified place or not applicable: Secondary | ICD-10-CM | POA: Insufficient documentation

## 2017-10-16 DIAGNOSIS — F419 Anxiety disorder, unspecified: Secondary | ICD-10-CM | POA: Insufficient documentation

## 2017-10-16 DIAGNOSIS — Y9389 Activity, other specified: Secondary | ICD-10-CM | POA: Insufficient documentation

## 2017-10-16 DIAGNOSIS — Z9049 Acquired absence of other specified parts of digestive tract: Secondary | ICD-10-CM | POA: Insufficient documentation

## 2017-10-16 DIAGNOSIS — Z79899 Other long term (current) drug therapy: Secondary | ICD-10-CM | POA: Insufficient documentation

## 2017-10-16 DIAGNOSIS — F17210 Nicotine dependence, cigarettes, uncomplicated: Secondary | ICD-10-CM | POA: Insufficient documentation

## 2017-10-16 DIAGNOSIS — W010XXA Fall on same level from slipping, tripping and stumbling without subsequent striking against object, initial encounter: Secondary | ICD-10-CM | POA: Insufficient documentation

## 2017-10-16 DIAGNOSIS — S63501A Unspecified sprain of right wrist, initial encounter: Secondary | ICD-10-CM

## 2017-10-16 MED ORDER — TRAMADOL HCL 50 MG PO TABS: 50.0000 mg | ORAL_TABLET | Freq: Four times a day (QID) | ORAL | 0 refills | Status: DC | PRN

## 2017-10-16 MED ORDER — NAPROXEN 500 MG PO TABS: 500.0000 mg | ORAL_TABLET | Freq: Two times a day (BID) | ORAL | 0 refills | Status: DC
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
## 2017-10-16 NOTE — Discharge Instructions (Signed)
Please follow up with the orthopedic doctor for symptoms that are not improving over the week.
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
## 2017-11-06 ENCOUNTER — Encounter: Payer: Self-pay | Admitting: *Deleted

## 2017-11-06 NOTE — Progress Notes (Signed)
Received unsigned returned certified letter.  Will close case as a refusal to follow-up.

## 2017-11-12 ENCOUNTER — Other Ambulatory Visit: Payer: Self-pay

## 2017-11-12 ENCOUNTER — Emergency Department
Admission: EM | Admit: 2017-11-12 | Discharge: 2017-11-12 | Disposition: A | Discharge status: Home / Self Care | Payer: Self-pay | Attending: Emergency Medicine | Admitting: Emergency Medicine

## 2017-11-12 ENCOUNTER — Encounter: Payer: Self-pay | Admitting: Emergency Medicine

## 2017-11-12 DIAGNOSIS — T7840XA Allergy, unspecified, initial encounter: Secondary | ICD-10-CM | POA: Insufficient documentation

## 2017-11-12 DIAGNOSIS — F17210 Nicotine dependence, cigarettes, uncomplicated: Secondary | ICD-10-CM | POA: Insufficient documentation

## 2017-11-12 DIAGNOSIS — F419 Anxiety disorder, unspecified: Secondary | ICD-10-CM | POA: Insufficient documentation

## 2017-11-12 DIAGNOSIS — Z9049 Acquired absence of other specified parts of digestive tract: Secondary | ICD-10-CM | POA: Insufficient documentation

## 2017-11-12 DIAGNOSIS — Z79899 Other long term (current) drug therapy: Secondary | ICD-10-CM | POA: Insufficient documentation

## 2017-11-12 MED ORDER — PREDNISONE 50 MG PO TABS: 50.0000 mg | ORAL_TABLET | Freq: Every day | ORAL | 0 refills | Status: DC

## 2017-11-12 MED ORDER — HYDROXYZINE HCL 25 MG PO TABS: 25.0000 mg | ORAL_TABLET | Freq: Three times a day (TID) | ORAL | 0 refills | Status: DC | PRN

## 2017-11-12 MED ORDER — HYDROXYZINE HCL 25 MG PO TABS
25.0000 mg | ORAL_TABLET | Freq: Once | ORAL | Status: AC
  Administered 2017-11-12: 25 mg via ORAL
  Filled 2017-11-12: qty 1

## 2017-11-12 MED ORDER — FAMOTIDINE IN NACL 20-0.9 MG/50ML-% IV SOLN
20.0000 mg | Freq: Once | INTRAVENOUS | Status: AC
  Administered 2017-11-12: 20 mg via INTRAVENOUS
  Filled 2017-11-12: qty 50

## 2017-11-12 MED ORDER — METHYLPREDNISOLONE SODIUM SUCC 125 MG IJ SOLR
125.0000 mg | Freq: Once | INTRAMUSCULAR | Status: AC
  Administered 2017-11-12: 125 mg via INTRAVENOUS
  Filled 2017-11-12: qty 2

## 2017-11-12 NOTE — ED Notes (Signed)
ED Provider at bedside. 

## 2017-11-12 NOTE — ED Notes (Signed)
Pt reports waking up itching all over her body. States "I must be having an allergic reaction." Pt has "many allergies" but "usually Zyrtec works" for everything. Pt alert and oriented. Able to answer questions. Tachypnic at this time.  Pt took  of benedryl PO approx 1.5 hours ago. No improvement at this time.

## 2017-11-12 NOTE — ED Triage Notes (Signed)
Pt came in c/o itching and swelling to tongue/mouth.  C/o tingling and itching everywhere and that is having an allergic reaction.  Denies new products. Very anxious and hyperventilating. Reports took benadryl 1 hr ago without relief.  No swelling or rash visualized.  Pt remains very panicked in triage but no visible signs of reaction. Denies lisinopril use. Denies this being anxiety but has hx of.

## 2017-11-12 NOTE — ED Provider Notes (Signed)
[facility] Emergency Department Provider Note   ____________________________________________    I have reviewed the triage vital signs and the nursing notes.   HISTORY  Chief Complaint Allergic Reaction     HPI Anne [name] is a 42 y.o. female who presents with complaints of allergic reaction.  Patient reports she woke up this morning felt itchy all over.  She complains of tingling in her tongue but no swelling.  No difficulty breathing.  She took 100 mg of p.o. Benadryl approximately an hour prior to arrival but she feels it has not significantly helped.  No reported history of anaphylaxis.  Does report a history of allergy testing with several relatively minor allergies.  No new medications.  No new foods.  Past Medical History:  Diagnosis Date  . Anxiety   . Esophageal tear   . GERD (gastroesophageal reflux disease)     There are no active problems to display for this patient.   Past Surgical History:  Procedure Laterality Date  . ABDOMINAL HYSTERECTOMY    . CHOLECYSTECTOMY    . NASAL SINUS SURGERY    . TONSILLECTOMY      Prior to Admission medications   Medication Sig Start Date End Date Taking? Authorizing Provider  albuterol (PROVENTIL HFA;VENTOLIN HFA) 108 (90 Base) MCG/ACT inhaler Inhale 2 puffs into the lungs every 6 (six) hours as needed for wheezing or shortness of breath. 07/23/16   Governor Rooks, MD  cetirizine (ZYRTEC) 10 MG tablet Take 10 mg by mouth daily.    [provider]  esomeprazole (NEXIUM) 20 MG capsule Take 20 mg by mouth daily at 12 noon.    [provider]  hydrOXYzine (ATARAX/VISTARIL) 25 MG tablet Take 1 tablet (25 mg total) by mouth every 8 (eight) hours as needed for itching. 11/12/17   Jene Every, MD  ibuprofen (ADVIL,MOTRIN) 600 MG tablet Take 1 tablet (600 mg total) by mouth every 8 (eight) hours as needed. 01/01/17   Tommi Rumps, PA-C  methocarbamol (ROBAXIN) 500 MG tablet 1-2  tablet qid prn muscles 01/01/17   Tommi Rumps, PA-C  multivitamin (ONE-A-DAY MEN'S) TABS tablet Take 1 tablet by mouth daily.    [provider]  predniSONE (DELTASONE) 50 MG tablet Take 1 tablet (50 mg total) by mouth daily with breakfast. 11/12/17   Jene Every, MD  traMADol (ULTRAM) 50 MG tablet Take 1 tablet (50 mg total) by mouth every 6 (six) hours as needed. 10/16/17   Triplett, Kasandra Knudsen, FNP     Allergies Ondansetron; Compazine [prochlorperazine edisylate]; Phenergan [promethazine hcl]; and Tape  History reviewed. No pertinent family history.  Social History Social History   Tobacco Use  . Smoking status: Current Every Day Smoker    Packs/day: 0.50    Years: 20.00    Pack years: 10.00    Types: Cigarettes  . Smokeless tobacco: Never Used  Substance Use Topics  . Alcohol use: Yes    Comment: occasional  . Drug use: Not on file    Review of Systems  Constitutional: No dizziness Eyes: No visual changes.  ENT: No throat swelling or tongue swelling Cardiovascular: Denies chest pain. Respiratory: No wheezing Gastrointestinal:No nausea, no vomiting.   Genitourinary: Negative for dysuria. Musculoskeletal: Negative for back pain. Skin: No hives Neurological: Negative for headaches    ____________________________________________   PHYSICAL EXAM:  VITAL SIGNS: ED Triage Vitals  Enc Vitals Group     BP 11/12/17 0702 123/87     Pulse  Rate 11/12/17 0702 (!) 141     Resp 11/12/17 0702 (!) 30     Temp 11/12/17 0702 98.6 F (37 C)     Temp Source 11/12/17 0702 Oral     SpO2 11/12/17 0702 94 %     Weight 11/12/17 0703 77.1 kg (170 lb)     Height 11/12/17 0703 1.6 m ( )     Head Circumference --      Peak Flow --      Pain Score 11/12/17 0702 0     Pain Loc --      Pain Edu? --      Excl. in GC? --     Constitutional: Alert and oriented.  Anxious Eyes: Conjunctivae are normal.   Nose: No congestion/rhinnorhea. Mouth/Throat: Mucous membranes  are moist.  Pharynx is normal, no uvular swelling, no pharyngeal swelling, no tongue swelling. no stridor.  No facial swelling Neck: Range of motion comfortable, painless Cardiovascular: Normal rate, regular rhythm. Grossly normal heart sounds.  Good peripheral circulation. Respiratory: Normal respiratory effort.  No retractions. Lungs CTAB. Gastrointestinal: Soft and nontender. No distention.  No CVA tenderness.  Musculoskeletal: No extremity swelling.  Warm and well perfused Neurologic:  Normal speech and language. No gross focal neurologic deficits are appreciated.  Skin:  Skin is warm, dry and intact. No hives Psychiatric: Mood and affect are normal. Speech and behavior are normal.  ____________________________________________   LABS (all labs ordered are listed, but only abnormal results are displayed)  Labs Reviewed - No data to display ____________________________________________  EKG  None ____________________________________________  RADIOLOGY  None ____________________________________________   PROCEDURES  Procedure(s) performed: No  Procedures   Critical Care performed: No ____________________________________________   INITIAL IMPRESSION / ASSESSMENT AND PLAN / ED COURSE  Pertinent labs & imaging results that were available during my care of the patient were reviewed by me and considered in my medical decision making (see chart for details).  Patient presents with likely allergic reaction, manifested primarily by pruritus.  No hives noted in the emergency department.  Will treat with IV Solu-Medrol, IV Pepcid and monitor closely in the emergency department.  ----------------------------------------- 8:15 AM on 11/12/2017 -----------------------------------------  Patient appears improved, still no rash, no intraoral swelling.  He still complains of mild itching.  Will give p.o. Atarax, remains highly  anxious  ----------------------------------------- 10:06 AM on 11/12/2017 -----------------------------------------  Patient reports symptoms of nearly completely abated, she would like to go home.  She appears safe for discharge, return precautions discussed    ____________________________________________   FINAL CLINICAL IMPRESSION(S) / ED DIAGNOSES  Final diagnoses:  Allergic reaction, initial encounter        Note:  This document was prepared using Dragon voice recognition software and may include unintentional dictation errors.    Jene Every, MD 11/12/17 1006

## 2017-11-12 NOTE — ED Notes (Signed)
Pt ambulatory upon discharge. Verbalized understanding of discharge instructions, follow-up care and prescriptions. VSS. Skin warm and dry. A&O x4.  

## 2018-12-11 ENCOUNTER — Other Ambulatory Visit: Payer: Self-pay

## 2018-12-11 ENCOUNTER — Emergency Department
Admission: EM | Admit: 2018-12-11 | Discharge: 2018-12-11 | Disposition: A | Discharge status: Home / Self Care | Payer: Self-pay | Attending: Emergency Medicine | Admitting: Emergency Medicine

## 2018-12-11 DIAGNOSIS — F17210 Nicotine dependence, cigarettes, uncomplicated: Secondary | ICD-10-CM | POA: Insufficient documentation

## 2018-12-11 DIAGNOSIS — N611 Abscess of the breast and nipple: Secondary | ICD-10-CM | POA: Insufficient documentation

## 2018-12-11 MED ORDER — LIDOCAINE HCL (PF) 1 % IJ SOLN: 5.0000 mL | Freq: Once | INTRAMUSCULAR | Status: DC

## 2018-12-11 MED ORDER — SULFAMETHOXAZOLE-TRIMETHOPRIM 800-160 MG PO TABS
1.0000 | ORAL_TABLET | Freq: Two times a day (BID) | ORAL | 0 refills | Status: DC
Start: 2018-12-11 — End: 2019-05-30

## 2018-12-11 MED ORDER — CEPHALEXIN 500 MG PO CAPS: 500.0000 mg | ORAL_CAPSULE | Freq: Three times a day (TID) | ORAL | 0 refills | Status: AC

## 2018-12-11 MED ORDER — TRAMADOL HCL 50 MG PO TABS: 50.0000 mg | ORAL_TABLET | Freq: Four times a day (QID) | ORAL | 0 refills | Status: DC | PRN

## 2018-12-11 NOTE — Discharge Instructions (Signed)
Please return to the emergency department if you have any symptoms that change or worsen.  If you are not improving over the next 2 to 3 days, please call the breast care center and advised them that you had a needle aspiration of breast abscess today and request to be evaluated there.  If they are unable to get you in, return to the emergency department.

## 2018-12-11 NOTE — ED Provider Notes (Signed)
[facility] Emergency Department Provider Note  ____________________________________________  Time seen: Approximately 12:53 PM  I have reviewed the triage vital signs and the nursing notes.   HISTORY  Chief Complaint Abscess   HPI Anne [name] is a 43 y.o. female who presents to the emergency department for treatment and evaluation of breast lesion.  She states that the area started as a small pimple approximately 1 month ago and has progressively worsened over the past few days.  She states that she called breast care center and was advised that she needed to come to the emergency department.  Patient states that she has mammograms every 6 months due to fibrocystic breast disease.  All mammograms and ultrasounds have been reassuring.  She has not required lumpectomy or biopsy.  Last mammogram and breast ultrasound was January  2020.  Past Medical History:  Diagnosis Date  . Anxiety   . Esophageal tear   . GERD (gastroesophageal reflux disease)     There are no active problems to display for this patient.   Past Surgical History:  Procedure Laterality Date  . ABDOMINAL HYSTERECTOMY    . CHOLECYSTECTOMY    . NASAL SINUS SURGERY    . TONSILLECTOMY      Prior to Admission medications   Medication Sig Start Date End Date Taking? Authorizing Provider  albuterol (PROVENTIL HFA;VENTOLIN HFA) 108 (90 Base) MCG/ACT inhaler Inhale 2 puffs into the lungs every 6 (six) hours as needed for wheezing or shortness of breath. 07/23/16   Governor RooksLord, Rebecca, MD  cephALEXin (KEFLEX) 500 MG capsule Take 1 capsule (500 mg total) by mouth 3 (three) times daily for 10 days. 12/11/18 12/21/18  Dashauna Heymann, Rulon Eisenmengerari B, FNP  cetirizine (ZYRTEC) 10 MG tablet Take 10 mg by mouth daily.    [provider]  esomeprazole (NEXIUM) 20 MG capsule Take 20 mg by mouth daily at 12 noon.    [provider]  hydrOXYzine (ATARAX/VISTARIL) 25 MG tablet Take 1 tablet (25 mg total) by  mouth every 8 (eight) hours as needed for itching. 11/12/17   Jene EveryKinner, Robert, MD  ibuprofen (ADVIL,MOTRIN) 600 MG tablet Take 1 tablet (600 mg total) by mouth every 8 (eight) hours as needed. 01/01/17   Tommi RumpsSummers, Rhonda L, PA-C  methocarbamol (ROBAXIN) 500 MG tablet 1-2 tablet qid prn muscles 01/01/17   Tommi RumpsSummers, Rhonda L, PA-C  multivitamin (ONE-A-DAY MEN'S) TABS tablet Take 1 tablet by mouth daily.    [provider]  predniSONE (DELTASONE) 50 MG tablet Take 1 tablet (50 mg total) by mouth daily with breakfast. 11/12/17   Jene EveryKinner, Robert, MD  sulfamethoxazole-trimethoprim (BACTRIM DS) 800-160 MG tablet Take 1 tablet by mouth 2 (two) times daily. 12/11/18   Dace Denn, Rulon Eisenmengerari B, FNP  traMADol (ULTRAM) 50 MG tablet Take 1 tablet (50 mg total) by mouth every 6 (six) hours as needed. 12/11/18   Iran Kievit, Kasandra Knudsenari B, FNP    Allergies Ondansetron; Compazine [prochlorperazine edisylate]; Phenergan [promethazine hcl]; and Tape  No family history on file.  Social History Social History   Tobacco Use  . Smoking status: Current Every Day Smoker    Packs/day: 0.50    Years: 20.00    Pack years: 10.00    Types: Cigarettes  . Smokeless tobacco: Never Used  Substance Use Topics  . Alcohol use: Yes    Comment: occasional  . Drug use: Not on file    Review of Systems  Constitutional: Positive for fever. Respiratory: Negative for cough or shortness of breath.  Musculoskeletal: Negative for myalgias Skin: Positive for breast abscess Neurological: Negative for numbness or paresthesias. ____________________________________________   PHYSICAL EXAM:  VITAL SIGNS: ED Triage Vitals  Enc Vitals Group     BP 12/11/18 1027 119/70     Pulse Rate 12/11/18 1027 94     Resp 12/11/18 1027 16     Temp 12/11/18 1027 99.6 F (37.6 C)     Temp Source 12/11/18 1027 Oral     SpO2 12/11/18 1027 98 %     Weight 12/11/18 1028 175 lb (79.4 kg)     Height 12/11/18 1028  (1.6 m)     Head Circumference --       Peak Flow --      Pain Score 12/11/18 1030 6     Pain Loc --      Pain Edu? --      Excl. in GC? --      Constitutional: Well appearing. Eyes: Conjunctivae are clear without discharge or drainage. Nose: No rhinorrhea noted. Mouth/Throat: Airway is patent.  Neck: No stridor. Unrestricted range of motion observed. Cardiovascular: Capillary refill is <3 seconds.  Respiratory: Respirations are even and unlabored.. Musculoskeletal: Unrestricted range of motion observed. Neurologic: Awake, alert, and oriented x 4.  Skin: Fluctuant area on the left breast with surrounding erythema and induration.  ____________________________________________   LABS (all labs ordered are listed, but only abnormal results are displayed)  Labs Reviewed  BODY FLUID CULTURE   ____________________________________________  EKG  Not indicated. ____________________________________________  RADIOLOGY  Not indicated ____________________________________________   PROCEDURES  .Marland KitchenIncision and Drainage Date/Time: 12/11/2018 12:58 PM Performed by: Chinita Pester, FNP Authorized by: Chinita Pester, FNP   Consent:    Consent obtained:  Verbal   Consent given by:  Patient   Risks discussed:  Bleeding, infection, incomplete drainage and pain   Alternatives discussed:  Alternative treatment, delayed treatment and observation Location:    Type:  Abscess   Location: left breast. Pre-procedure details:    Skin preparation:  Betadine Anesthesia (see MAR for exact dosages):    Anesthesia method:  Local infiltration   Local anesthetic:  Lidocaine 1% WITH epi Procedure type:    Complexity:  Complex Procedure details:    Needle aspiration: yes     Needle size:  18 G   Wound management:  Irrigated with saline   Drainage:  Bloody and purulent   Drainage amount:  Moderate   Packing materials:  None Post-procedure details:    Patient tolerance of procedure:  Tolerated well, no immediate  complications   ____________________________________________   INITIAL IMPRESSION / ASSESSMENT AND PLAN / ED COURSE  Anne [name] is a 43 y.o. female who presents to the emergency department for drainage of breast abscess.  The abscess was easily palpable with a fluctuant center.  Needle aspiration was performed as above.  Because the patient has had thorough breast screenings with the most recent being January, I did not feel that she needed mammography today.  She was advised that she needs to call the breast care center to request follow-up, especially if she has not had significant improvement over the next 2 days while on Keflex and Bactrim.   Medications  lidocaine (PF) (XYLOCAINE) 1 % injection 5 mL (has no administration in time range)     Pertinent labs & imaging results that were available during my care of the patient were reviewed by me and considered in my medical decision making (see chart for details).  ____________________________________________   FINAL CLINICAL IMPRESSION(S) / ED DIAGNOSES  Final diagnoses:  Breast abscess    ED Discharge Orders         Ordered    sulfamethoxazole-trimethoprim (BACTRIM DS) 800-160 MG tablet  2 times daily     12/11/18 1159    cephALEXin (KEFLEX) 500 MG capsule  3 times daily     12/11/18 1159    traMADol (ULTRAM) 50 MG tablet  Every 6 hours PRN     12/11/18 1159           Note:  This document was prepared using Dragon voice recognition software and may include unintentional dictation errors.   Chinita Pester, FNP 12/11/18 1300    Sharman Cheek, MD 12/11/18 [phone]

## 2018-12-11 NOTE — ED Triage Notes (Signed)
Pt c/o swelling and redness to the left breast, states it started out like a pimple about a month ago and is now inflamed and tender

## 2018-12-11 NOTE — ED Notes (Signed)
Fluid aspirated from patients breast tissue. Specimen sent to lab.

## 2018-12-14 LAB — BODY FLUID CULTURE

## 2018-12-27 ENCOUNTER — Telehealth: Payer: Self-pay | Admitting: Emergency Medicine

## 2018-12-27 NOTE — Telephone Encounter (Signed)
Called patient in response to voicemail message.  She says that her wound was improving while she was on the antibiotics, but it is now worsening to the level it was when she was seen 12/11/18.  I told her that she needs to do follow up visit.  Her pcp cannot see her for a month, and the breast center will not see her without a referral from her pcp.  Discussed options for follow up and she says she will follow up with [facility].  I explained that unc should be able to see the records and culture from here.

## 2019-05-01 ENCOUNTER — Other Ambulatory Visit: Payer: Self-pay

## 2019-05-01 ENCOUNTER — Encounter: Payer: Self-pay | Admitting: Internal Medicine

## 2019-05-01 ENCOUNTER — Ambulatory Visit (INDEPENDENT_AMBULATORY_CARE_PROVIDER_SITE_OTHER): Payer: Self-pay | Admitting: Internal Medicine

## 2019-05-01 VITALS — BP 104/74 | HR 85 | Temp 97.2°F | Ht 64.0 in | Wt 176.0 lb

## 2019-05-01 DIAGNOSIS — R05 Cough: Secondary | ICD-10-CM

## 2019-05-01 DIAGNOSIS — R053 Chronic cough: Secondary | ICD-10-CM

## 2019-05-01 DIAGNOSIS — Z87891 Personal history of nicotine dependence: Secondary | ICD-10-CM

## 2019-05-01 DIAGNOSIS — R0600 Dyspnea, unspecified: Secondary | ICD-10-CM

## 2019-05-01 DIAGNOSIS — Z8709 Personal history of other diseases of the respiratory system: Secondary | ICD-10-CM

## 2019-05-01 DIAGNOSIS — R918 Other nonspecific abnormal finding of lung field: Secondary | ICD-10-CM

## 2019-05-01 DIAGNOSIS — R042 Hemoptysis: Secondary | ICD-10-CM

## 2019-05-01 DIAGNOSIS — R0609 Other forms of dyspnea: Secondary | ICD-10-CM

## 2019-05-01 LAB — SEDIMENTATION RATE: Sed Rate: 24 mm/hr — ABNORMAL HIGH (ref 0–20)

## 2019-05-01 NOTE — Progress Notes (Signed)
° °Subjective:  ° ° Patient ID: Anne [name], female    DOB: 01/28/1976, 43 y.o.   MRN: [id number] ° °HPI °43-year-old obese smoker self-referred.  She is uninsured.  She gets her care normally at [facility].)  Her best friend is Anne [name] who is the widow of my disease patient Anne [name] who died from he ILD, renal failure and HPS ° ° °[facility] Integrated Comprehensive ILD Questionnaire ° °Symptoms: Reports insidious onset of shortness of breath for the last 1 year.  There is also repeat episodic shortness of breath.  She reports no dyspnea at rest but when she takes a shower or does bathing dyspnea level is 3 and when she does household work dyspnea level is 4 walking up stairs and walking up a hill dyspnea level is 4.  There is no joint issues or any other issues other than cough she does clear the throat a lot.  Cough started years ago and actually preceded shortness of breath.  It is worse since it started it is severe nature she does cough at night and she does sometimes bring up sputum that is clear and sometimes yellow.  In addition she also has like fatigue no appetite and body aches.  She says she is lost a lot of weight.  Symptoms are moderate ° ° °Past Medical History : She has chronic recurrent sinusitis-She had apparently somebody that she has COPD but this might not be true.  She does smoke a lot.  There is a history of acid reflux and hiatal hernia since the year 2000.  She also reports occasional seizures but apparently nobody has figured out why.  In the 1990s she had mononucleosis.  She also reports recurrent pneumonias.  But she denies any heart failure or rheumatoid arthritis or collagen vascular disease.  Denies any pulmonary embolism or heart disease or pleurisy or thyroid disease or stroke or diabetes ° ° °ROS: She has fatigue for the last 6 months along with arthralgias and dry eyes and mouth but no dysphagia.  She is also lost 10 pounds and she has daily nausea with acid  reflux there is some snoring .  But no discoloration of her fingers no ulcers. ° °FAMILY HISTORY of LUNG DISEASE:  -Paternal grandmother with COPD and son with asthma but no pulmonary fibrosis or autoimmune disease ° ° °EXPOSURE HISTORY:  -She smokes cigarettes.  99 patient started smoking 1 pack a day.  Is a 25 pack smoking history.  She smells of tobacco through her mask.  For the last few years she does vape marijuana on an occasional basis since 2016.  She says she does one hit every 2 or 3 days.  Denies any cocaine use or intravenous drug use or electronic tobacco vaping ° ° °HOME and HOBBY DETAILS : [place].  The home is 41 years of age.  In the past she lived in a house for 10 years that had asbestos and mold in it.  Currently in the mobile home there is no shower curtain with mildew.  No fountain inside no Jacuzzi no birds or parakeets.  No feather pillow.  No contaminated mold in the AC duct.  Does not play any wind instruments.  Does not do any gardening. ° ° °OCCUPATIONAL HISTORY (122 questions) : Admits to living in the past in [place] condition spaces and having an asbestos exposure.  She has worked as   a bakery worker and food production details are not known.  Has worked as a cleaner cleaning construction homes.  Rest of the history is negative ° ° °PULMONARY TOXICITY HISTORY (27 items): Negative except short) on 2 months ago she does take medicines for acid reflux ° ° ° °Imaging: In 2018 changed up in the ER a CT angiogram chest rule out pulmonary embolism.  I personally visualized this.  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  In 2020 August she had a CT scan of the angiogram chest at [facility].  I was able to see the images through her mobile phone.  She does have similar groundglass opacities but they are in different location particularly in the lower lobe.  She reported a  history of hemoptysis on both occasions ° °Simple office walk 185 feet x  3 laps goal with forehead probe 05/01/2019 °  °O2 used ra  °Number laps completed 3  °Comments about pace moderate  °Resting Pulse Ox/HR 99% and 84/min  °Final Pulse Ox/HR 100% and 114/min  °Desaturated </= 88% no  °Desaturated <= 3% points no  °Got Tachycardic >/= 90/min no  °Symptoms at end of test dizzy  °Miscellaneous comments x  ° ° ° °Results for Cervantes, Anne [name] (MRN [id number]) as of 05/01/2019 12:07 ° Ref. Range 07/23/2016 12:23  °Eosinophils Absolute Latest Ref Range: 0 - 0.7 K/uL 0.1  ° °Results for Grudzinski, Anne [name] (MRN [id number]) as of 05/01/2019 12:07 ° Ref. Range 07/23/2016 12:23  °Creatinine Latest Ref Range: 0.44 - 1.00 mg/dL 0.86  ° ° °CT chest on July 23, 2016 that I personally visualized and I agree with Dr. Entrikin's findings below. °Lungs/Pleura: Faint centrilobular ground-glass attenuation °micronodularity is noted throughout the lungs bilaterally, most °evident throughout the mid mid to upper lungs. In the lower lungs °there is also some patchy ill-defined ground-glass attenuation which °is very mild. No confluent consolidative airspace disease. No °pleural effusions. No larger more suspicious appearing pulmonary °nodules or masses. °  °Upper Abdomen: Status post cholecystectomy. °  °Musculoskeletal: There are no aggressive appearing lytic or blastic °lesions noted in the visualized portions of the skeleton. °  °Review of the MIP images confirms the above findings. °  °IMPRESSION: °1. No evidence of pulmonary embolism. °2. Subtle pattern of centrilobular ground-glass attenuation °micronodularity most evident throughout the mid to upper lungs, °concerning for smoking related disease such as respiratory °bronchiolitis (RB), or given the patient's symptomatology, °respiratory bronchiolitis-interstitial lung disease (RB-ILD). In °addition, there is some very mild patchy ground-glass attenuation °throughout the mid to  lower lungs bilaterally which could also be °smoking related and can be seen in the setting of desquamative °interstitial pneumonia (DIP). Counseling for smoking cessation is °strongly recommended. °  °  °Electronically Signed °  By: Daniel  Entrikin M.D. °  On: 07/23/2016 14:08 °Review of Systems  °Constitutional: Negative for fever and unexpected weight change.  °HENT: Positive for sinus pressure. Negative for congestion, dental problem, ear pain, nosebleeds, postnasal drip, rhinorrhea, sneezing, sore throat and trouble swallowing.   °Eyes: Negative for redness and itching.  °Respiratory: Positive for shortness of breath. Negative for cough, chest tightness and wheezing.   °Cardiovascular: Negative for palpitations and leg swelling.  °Gastrointestinal: Negative for nausea and vomiting.  °Genitourinary: Negative for dysuria.  °Musculoskeletal: Negative for joint swelling.  °Skin: Negative for rash.  °Neurological: Negative for headaches.  °Hematological: Does not bruise/bleed easily.  °Psychiatric/Behavioral: Negative for dysphoric mood. The patient is not nervous/anxious.   ° ° ° °   has a past medical history of Anxiety, Esophageal tear, and GERD (gastroesophageal reflux disease).   reports that she has been smoking cigarettes. She has a 27.00 pack-year smoking history. She has never used smokeless tobacco.  Past Surgical History:  Procedure Laterality Date   ABDOMINAL HYSTERECTOMY     CHOLECYSTECTOMY     NASAL SINUS SURGERY     TONSILLECTOMY      Allergies  Allergen Reactions   Ondansetron Other (See Comments)    Altered mental status   Compazine [Prochlorperazine Edisylate] Other (See Comments)    Altered mental status   Phenergan [Promethazine Hcl] Other (See Comments)    Altered mental status   Tape Rash     There is no immunization history on file for this patient.  No family history on file.   Current Outpatient Medications:    albuterol (PROVENTIL HFA;VENTOLIN HFA) 108  (90 Base) MCG/ACT inhaler, Inhale 2 puffs into the lungs every 6 (six) hours as needed for wheezing or shortness of breath., Disp: 1 Inhaler, Rfl: 0   cetirizine (ZYRTEC) 10 MG tablet, Take 10 mg by mouth daily., Disp: , Rfl:    esomeprazole (NEXIUM) 20 MG capsule, Take 20 mg by mouth daily at 12 noon., Disp: , Rfl:    hydrOXYzine (ATARAX/VISTARIL) 25 MG tablet, Take 1 tablet (25 mg total) by mouth every 8 (eight) hours as needed for itching., Disp: 12 tablet, Rfl: 0   ibuprofen (ADVIL,MOTRIN) 600 MG tablet, Take 1 tablet (600 mg total) by mouth every 8 (eight) hours as needed., Disp: 30 tablet, Rfl: 0   multivitamin (ONE-A-DAY MEN'S) TABS tablet, Take 1 tablet by mouth daily., Disp: , Rfl:    methocarbamol (ROBAXIN) 500 MG tablet, 1-2 tablet qid prn muscles, Disp: 20 tablet, Rfl: 0   predniSONE (DELTASONE) 50 MG tablet, Take 1 tablet (50 mg total) by mouth daily with breakfast., Disp: 5 tablet, Rfl: 0   sulfamethoxazole-trimethoprim (BACTRIM DS) 800-160 MG tablet, Take 1 tablet by mouth 2 (two) times daily., Disp: 20 tablet, Rfl: 0   traMADol (ULTRAM) 50 MG tablet, Take 1 tablet (50 mg total) by mouth every 6 (six) hours as needed., Disp: 12 tablet, Rfl: 0      Objective:   Physical Exam  Vitals:   05/01/19 1152  BP: 104/74  Pulse: 85  Temp: (!) 97.2 F (36.2 C)  TempSrc: Skin  SpO2: 99%  Weight: 176 lb (79.8 kg)  Height: 5' 4" (1.626 m)    Estimated body mass index is 30.21 kg/m as calculated from the following:   Height as of this encounter: 5' 4" (1.626 m).   Weight as of this encounter: 176 lb (79.8 kg).  General Appearance:    Alert, cooperative, no distress, appears stated age - yes , sitting on - chair  Head:    Normocephalic, without obvious abnormality, atraumatic  Eyes:    PERRL, conjunctiva/corneas clear,  Ears:    Normal TM's and external ear canals, both ears  Nose:   Nares normal, septum midline, mucosa normal, no drainage    or sinus tenderness. OXYGEN  ON no @ RA  Throat:   Lips, mucosa, and tongue normal; teeth and gums aBNORMAL Cyanosis on lips - no. SMELLS OF TOBACCO through the mask  Neck:   Supple, symmetrical, trachea midline, no adenopathy;    thyroid:  no enlargement/tenderness/nodules; no carotid   bruit or JVD  Back:     Symmetric, no curvature, ROM normal, no CVA tenderness  Lungs:  Distress - no , Wheeze no, Barrell Chest - no, Purse lip breathing - no, Crackles - no   °Chest Wall:    No tenderness or deformity. Scars in chest no  ° Heart:    Regular rate and rhythm, S1 and S2 normal, no murmur, rub °  or gallop  °Breast Exam:    NOT DONE  °Abdomen:     Soft, non-tender, bowel sounds active all four quadrants,  °  no masses, no organomegaly  °Genitalia:   NOT DONE  °Rectal:   NOT DONE  °Extremities:   Extremities normal, atraumatic, Clubbing - no, Edema - no  °Pulses:   2+ and symmetric all extremities  °Skin:   Stigmata of Connective Tissue Disease - STIGMATA of CONNECTIVE TISSUE DISEASE ° °- Distal digital fissuring (ie, "mechanic hands") - no °- Distal digital tip ulceration - no °-Inflammatory arthritis or polyarticular morning joint stiffness ?60 minutes - no °- Palmar telangiectasia - no °- Raynaud phenomenon - no °- Unexplained digital edema - no °- Unexplained fixed rash on the digital extensor surfaces (Gottron's sign) - no °... °- Deformities of RA - no °- Scleroderma  - no °- Malar Rash -  no °  °Lymph nodes:   Cervical, supraclavicular, and axillary nodes normal  °Psychiatric:  °Neurologic:   plesasant °CNII-XII intact, normal strength, sensation  throughout  ° ° ° ° ° ° ° °   °Assessment & Plan:  ° °  ICD-10-CM   °1. Ground glass opacity present on imaging of lung  R91.8 CT Chest High Resolution  °2. Hemoptysis  R04.2 CT Chest High Resolution  °  Sedimentation rate  °  Angiotensin converting enzyme  °  ANA  °  Anti-DNA antibody, double-stranded  °  Rheumatoid factor  °  Cyclic citrul peptide antibody, IgG  °  ANCA Screen Reflex  Titer  °  Mpo/pr-3 (anca) antibodies  °  CK total and CKMB (cardiac)not at [facility]  °  Aldolase  °  Anti-scleroderma antibody  °  Sjogrens syndrome-A extractable nuclear antibody  °  Sjogrens syndrome-B extractable nuclear antibody  °  Jo-1 antibody-IgG  °  Jo-1 antibody-IgG  °  Sjogrens syndrome-B extractable nuclear antibody  °  Sjogrens syndrome-A extractable nuclear antibody  °  Anti-scleroderma antibody  °  Aldolase  °  CK total and CKMB (cardiac)not at [facility]  °  Mpo/pr-3 (anca) antibodies  °  Cyclic citrul peptide antibody, IgG  °  Rheumatoid factor  °  Anti-DNA antibody, double-stranded  °  ANA  °  Angiotensin converting enzyme  °  Sedimentation rate  °  ANCA Screen Reflex Titer  °3. Dyspnea on exertion  R06.00 CT Chest High Resolution  °  Sedimentation rate  °  Angiotensin converting enzyme  °  ANA  °  Anti-DNA antibody, double-stranded  °  Rheumatoid factor  °  Cyclic citrul peptide antibody, IgG  °  ANCA Screen Reflex Titer  °  Mpo/pr-3 (anca) antibodies  °  CK total and CKMB (cardiac)not at [facility]  °  Aldolase  °  Anti-scleroderma antibody  °  Sjogrens syndrome-A extractable nuclear antibody  °  Sjogrens syndrome-B extractable nuclear antibody  °  Jo-1 antibody-IgG  °  Jo-1 antibody-IgG  °  Sjogrens syndrome-B extractable nuclear antibody  °  Sjogrens syndrome-A extractable nuclear antibody  °  Anti-scleroderma antibody  °  Aldolase  °  CK total and CKMB (cardiac)not at [facility]  °  Mpo/pr-3 (anca) antibodies  °    Cyclic citrul peptide antibody, IgG    Rheumatoid factor    Anti-DNA antibody, double-stranded    ANA    Angiotensin converting enzyme    Sedimentation rate    ANCA Screen Reflex Titer  4. Chronic cough  R05   5. History of smoking 25-50 pack years  Z87.891   6. Personal history of chronic sinusitis  Z87.09      With a constellation of groundglass opacities in the lung, intermittent chronic hemoptysis and personal history of recurrent chronic sinusitis 0 top differential diagnosis coming to  mind is vasculitis. The other certainly respiratory bronchiolitis interstitial lung disease as denoted by the radiologist in 2018 but the infiltrates appear migratory in nature.  Hypersensitive pneumonitis is also possible given mold exposure and living in a damp environment.?  Bronchiolitis obliterans because she works in [employer] albeit not flavoring agents although the infiltrates look very inconsistent with the   Overall severity at this point in time appears to be mild given the normal walking desaturation test and the level of infiltrate seen on CT scan of the chest. Other etiologies such as emphysema, obesity and diastolic dysfunction  are certainly possible contributing to her cough and shortness of breath  PLAN    Do HRCT supine and prone - any time next few to several weeks  Serum: ESR, ACE, ANA, DS-DNA, RF, anti-CCP,  ANCA screen, MPO, PR-3, Total CK,  Aldolase,   scl-70, ssA, ssB, anti-RNP, anti-JO-1,  - do 05/01/2019   Followup Next few to several weeks but after above; decide on pulmonary function test later given scheduling issues and lack of insurance status         SIGNATURE    Dr. Brand Males, M.D., F.C.C.P,  Pulmonary and Critical Care Medicine Staff Physician, [place] Director - Interstitial Lung Disease  Program  Pulmonary [place] [facility] [place], [place], [address]  Pager: [phone], If no answer or between  15:00h - 7:00h: call [phone] Telephone: [phone]  1:04 PM 05/01/2019

## 2019-05-01 NOTE — Patient Instructions (Signed)
ICD-10-CM   1. Ground glass opacity present on imaging of lung  R91.8   2. Hemoptysis  R04.2   3. Dyspnea on exertion  R06.00   4. Chronic cough  R05   5. History of smoking 25-50 pack years  Z87.891   6. Personal history of chronic sinusitis  Z87.09      Do HRCT supine and prone - any time next few to several weeks  Serum: ESR, ACE, ANA, DS-DNA, RF, anti-CCP,  ANCA screen, MPO, PR-3, Total CK,  Aldolase,   scl-70, ssA, ssB, anti-RNP, anti-JO-1,  - do 05/01/2019   Followup Next few to several weeks but after above   

## 2019-05-03 LAB — ANA: Anti Nuclear Antibody (ANA): NEGATIVE

## 2019-05-03 LAB — MPO/PR-3 (ANCA) ANTIBODIES
Myeloperoxidase Abs: <1 AI
Serine Protease 3: <1 AI

## 2019-05-03 LAB — RHEUMATOID FACTOR: Rhuematoid fact SerPl-aCnc: <14 IU/mL (ref <14)

## 2019-05-03 LAB — CK TOTAL AND CKMB (NOT AT ARMC)
CK, MB: <0.7 ng/mL (ref 0–5.0)
Total CK: 26 U/L — ABNORMAL LOW (ref 29–143)

## 2019-05-03 LAB — ANTI-DNA ANTIBODY, DOUBLE-STRANDED: ds DNA Ab: <1 IU/mL

## 2019-05-03 LAB — CYCLIC CITRUL PEPTIDE ANTIBODY, IGG: Cyclic Citrullin Peptide Ab: <16 UNITS

## 2019-05-03 LAB — ANTI-SCLERODERMA ANTIBODY: Scleroderma (Scl-70) (ENA) Antibody, IgG: <1 AI

## 2019-05-03 LAB — ALDOLASE: Aldolase: 3.6 U/L (ref <=8.1)

## 2019-05-03 LAB — ANCA SCREEN W REFLEX TITER: ANCA Screen: NEGATIVE

## 2019-05-03 LAB — SJOGRENS SYNDROME-A EXTRACTABLE NUCLEAR ANTIBODY: SSA (Ro) (ENA) Antibody, IgG: <1 AI

## 2019-05-03 LAB — ANGIOTENSIN CONVERTING ENZYME: Angiotensin-Converting Enzyme: 23 U/L (ref 9–67)

## 2019-05-03 LAB — JO-1 ANTIBODY-IGG: Jo-1 Autoabs: <1 AI

## 2019-05-03 LAB — SJOGRENS SYNDROME-B EXTRACTABLE NUCLEAR ANTIBODY: SSB (La) (ENA) Antibody, IgG: <1 AI

## 2019-05-07 ENCOUNTER — Telehealth: Payer: Self-pay | Admitting: Internal Medicine

## 2019-05-07 NOTE — Telephone Encounter (Signed)
Advised pt of results. Pt understood and nothing further is needed.    Notes recorded by Brand Males, MD on 05/06/2019 at 3:55 PM EDT  Serology and vasculitis panel - negative

## 2019-05-07 NOTE — Telephone Encounter (Signed)
Anne [name] has this order pt wants [place] she said doesn't matter what time

## 2019-05-08 NOTE — Telephone Encounter (Signed)
CT has been scheduled and pt has been made aware.  Nothing further needed.

## 2019-05-13 NOTE — Progress Notes (Signed)
Anne [name], [place]    05/07/19 10:38 AM Note   Advised pt of results. Pt understood and nothing further is needed.    Notes recorded by Brand Males, MD on 05/06/2019 at 3:55 PM EDT  Serology and vasculitis panel - negative

## 2019-05-14 ENCOUNTER — Ambulatory Visit
Admission: RE | Admit: 2019-05-14 | Discharge: 2019-05-14 | Disposition: A | Discharge status: Home / Self Care | Payer: Self-pay | Source: Ambulatory Visit | Attending: Internal Medicine | Admitting: Internal Medicine

## 2019-05-14 ENCOUNTER — Other Ambulatory Visit: Payer: Self-pay

## 2019-05-14 DIAGNOSIS — R0609 Other forms of dyspnea: Secondary | ICD-10-CM

## 2019-05-14 DIAGNOSIS — R042 Hemoptysis: Secondary | ICD-10-CM

## 2019-05-14 DIAGNOSIS — R918 Other nonspecific abnormal finding of lung field: Secondary | ICD-10-CM

## 2019-05-14 DIAGNOSIS — R0600 Dyspnea, unspecified: Secondary | ICD-10-CM | POA: Insufficient documentation

## 2019-05-16 ENCOUNTER — Telehealth: Payer: Self-pay | Admitting: Internal Medicine

## 2019-05-16 DIAGNOSIS — R0609 Other forms of dyspnea: Secondary | ICD-10-CM

## 2019-05-16 NOTE — Telephone Encounter (Signed)
I agree that she needs to quarantine herself from her daughter for the next 2 weeks  I agree that she needs to monitor her symptoms and contact primary care physician if she gets worse  She can go get a Covid testing anytime today of the next few days however, this because it is negative does not mean it is truly negative this is because she could still develop Covid anytime in the next 2 weeks.  She can get one test today next few days and a repeat one again in a week especially the first 1 is negative

## 2019-05-16 NOTE — Telephone Encounter (Signed)
Call returned patient, she states her daughter tested positive for Covid yesterday who is 2. She reports her daughter had a headache, fever, nausea, vomiting, and diarrhea. She reports she does not have a PCP right now. She denies that she is currently having any symptoms at the moment. She is requesting to be sent for a Covid test in [place] if possible. She reports outside of some increasing headaches so feels fine. She is wanting to know what to do immediatley. I made her aware she needs to quarantine as much as possible away from her daughter until she hears back from [place]. If she starts developing systems notify someone as soon as possible. Requesting a covid test and recommendations.   MR please advise if we can place order for patient to get a Covid test and any further recommendations. Thanks.

## 2019-05-16 NOTE — Telephone Encounter (Signed)
Covid order placed. Info given for address of Covid testing in [place]. Made aware the hours are M-F 9-4. Made aware of MR recommendations. Voiced understanding. Aware she will need to repeat her covid test if negative.   Nothing further needed at this time.

## 2019-05-30 ENCOUNTER — Telehealth: Payer: Self-pay | Admitting: Internal Medicine

## 2019-05-30 ENCOUNTER — Encounter: Payer: Self-pay | Admitting: Internal Medicine

## 2019-05-30 ENCOUNTER — Other Ambulatory Visit: Payer: Self-pay

## 2019-05-30 ENCOUNTER — Ambulatory Visit (INDEPENDENT_AMBULATORY_CARE_PROVIDER_SITE_OTHER): Payer: Self-pay | Admitting: Internal Medicine

## 2019-05-30 VITALS — BP 98/72 | HR 88 | Temp 97.3°F | Ht 62.0 in | Wt 178.0 lb

## 2019-05-30 DIAGNOSIS — Z8709 Personal history of other diseases of the respiratory system: Secondary | ICD-10-CM

## 2019-05-30 DIAGNOSIS — R918 Other nonspecific abnormal finding of lung field: Secondary | ICD-10-CM

## 2019-05-30 DIAGNOSIS — Z87891 Personal history of nicotine dependence: Secondary | ICD-10-CM

## 2019-05-30 MED ORDER — ALBUTEROL SULFATE HFA 108 (90 BASE) MCG/ACT IN AERS: 2.0000 | INHALATION_SPRAY | Freq: Four times a day (QID) | RESPIRATORY_TRACT | 5 refills | Status: DC | PRN

## 2019-05-30 NOTE — Patient Instructions (Signed)
ICD-10-CM   1. Ground glass opacity present on imaging of lung  R91.8   2. History of smoking 25-50 pack years  Z87.891   3. Personal history of chronic sinusitis  Z87.09     Do bronchoscopy with small scope,  no TB risk  - for lavage. No biopsy planned Needs covid testing prior Preferred dates are   = [facility] Tuesday 06/03/2019 or Wedensday 06/04/2019,dec 3rd Thursday or dec 4th friday  late morning or early afternoon (least preferred of these are Tuesday 11/17 and dec 3rd Thursday)  Followup  - mid dec 2020 for rov of results  -

## 2019-05-30 NOTE — Telephone Encounter (Signed)
Attempted to call to schedule bronch for pt. Reached VM of Katie. Left her a message to return my call so I can get this scheduled for pt.

## 2019-05-30 NOTE — Addendum Note (Signed)
Addended by: Lorretta Harp on: 05/30/2019 12:41 PM   Modules accepted: Orders

## 2019-05-30 NOTE — Progress Notes (Signed)
.      Patient ID: Anne [name], female    DOB: 1976/01/07, 43 y.o.   MRN: [id number]  HPI 43 year old obese smoker self-referred.  She is uninsured.  She gets her care normally at [facility].)  Her best friend is Dewitt Hoes who is the widow of my disease patient Rosamaria Lints who died from he ILD, renal failure and HPS   [facility] Comprehensive ILD Questionnaire  Symptoms: Reports insidious onset of shortness of breath for the last 1 year.  There is also repeat episodic shortness of breath.  She reports no dyspnea at rest but when she takes a shower or does bathing dyspnea level is 3 and when she does household work dyspnea level is 4 walking up stairs and walking up a hill dyspnea level is 4.  There is no joint issues or any other issues other than cough she does clear the throat a lot.  Cough started years ago and actually preceded shortness of breath.  It is worse since it started it is severe nature she does cough at night and she does sometimes bring up sputum that is clear and sometimes yellow.  In addition she also has like fatigue no appetite and body aches.  She says she is lost a lot of weight.  Symptoms are moderate   Past Medical History : She has chronic recurrent sinusitis-She had apparently somebody that she has COPD but this might not be true.  She does smoke a lot.  There is a history of acid reflux and hiatal hernia since the year 2000.  She also reports occasional seizures but apparently nobody has figured out why.  In the 1990s she had mononucleosis.  She also reports recurrent pneumonias.  But she denies any heart failure or rheumatoid arthritis or collagen vascular disease.  Denies any pulmonary embolism or heart disease or pleurisy or thyroid disease or stroke or diabetes   ROS: She has fatigue for the last 6 months along with arthralgias and dry eyes and mouth but no dysphagia.  She is also lost 10 pounds and she has daily nausea with acid reflux  there is some snoring .  But no discoloration of her fingers no ulcers.  FAMILY HISTORY of LUNG DISEASE:  -Paternal grandmother with COPD and son with asthma but no pulmonary fibrosis or autoimmune disease   EXPOSURE HISTORY:  -She smokes cigarettes.  99 patient started smoking 1 pack a day.  Is a 25 pack smoking history.  She smells of tobacco through her mask.  For the last few years she does vape marijuana on an occasional basis since 2016.  She says she does one hit every 2 or 3 days.  Denies any cocaine use or intravenous drug use or electronic tobacco vaping   HOME and HOBBY DETAILS : [place].  The home is 41 years of age.  In the past she lived in a house for 10 years that had asbestos and mold in it.  Currently in the mobile home there is no shower curtain with mildew.  No fountain inside no Jacuzzi no birds or parakeets.  No feather pillow.  No contaminated mold in the [facility] duct.  Does not play any wind instruments.  Does not do any gardening.   OCCUPATIONAL HISTORY (122 questions) : Admits to living in the past in [place] condition spaces and having an asbestos exposure.  She has worked  as a Agricultural engineerbakery worker and food production details are not known.  Has worked as a Metallurgistcleaner cleaning construction homes.  Rest of the history is negative   PULMONARY TOXICITY HISTORY (27 items): Negative except short) on 2 months ago she does take medicines for acid reflux    Imaging: In 2018 changed up in the ER a CT angiogram chest rule out pulmonary embolism.  I personally visualized this.  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  In 2020 August she had a CT scan of the [facility]ogram chest [facility].  I was able to see the images through her mobile phone.  She does have similar groundglass opacities but they are in different location particularly in the lower lobe.  She reported a history of  hemoptysis on both occasions  Simple office walk 185 feet x  3 laps goal with forehead probe 05/01/2019   O2 used ra  Number laps completed 3  Comments about pace moderate  Resting Pulse Ox/HR 99% and 84/min  Final Pulse Ox/HR 100% and 114/min  Desaturated </= 88% no  Desaturated <= 3% points no  Got Tachycardic >/= 90/min no  Symptoms at end of test dizzy  Miscellaneous comments x     Results for Ronnell GuadalajaraCOUTURIER, Welma E (MRN [id number]) as of 05/01/2019 12:07  Ref. Range 07/23/2016 12:23  Eosinophils Absolute Latest Ref Range: 0 - 0.7 K/uL 0.1   Results for Ronnell GuadalajaraCOUTURIER, Obie E (MRN [id number]) as of 05/01/2019 12:07  Ref. Range 07/23/2016 12:23  Creatinine Latest Ref Range: 0.44 - 1.00 mg/dL 7.820.86    CT chest on July 23, 2016 that I personally visualized and I agree with Dr. Debara PickettEntrikin's findings below. Lungs/Pleura: Faint centrilobular ground-glass attenuation micronodularity is noted throughout the lungs bilaterally, most evident throughout the mid mid to upper lungs. In the lower lungs there is also some patchy ill-defined ground-glass attenuation which is very mild. No confluent consolidative airspace disease. No pleural effusions. No larger more suspicious appearing pulmonary nodules or masses.  Upper Abdomen: Status post cholecystectomy.  Musculoskeletal: There are no aggressive appearing lytic or blastic lesions noted in the visualized portions of the skeleton.  Review of the MIP images confirms the above findings.  IMPRESSION: 1. No evidence of pulmonary embolism. 2. Subtle pattern of centrilobular ground-glass attenuation micronodularity most evident throughout the mid to upper lungs, concerning for smoking related disease such as respiratory bronchiolitis (RB), or given the patient's symptomatology, respiratory bronchiolitis-interstitial lung disease (RB-ILD). In addition, there is some very mild patchy ground-glass attenuation throughout the mid to lower lungs  bilaterally which could also be smoking related and can be seen in the setting of desquamative interstitial pneumonia (DIP). Counseling for smoking cessation is strongly recommended.   Electronically Signed   By: Trudie Reedaniel  Entrikin M.D.   On: 07/23/2016 14:08  OV 05/30/2019  Subjective:  Patient ID: Marvia E Burdin, female , DOB: Feb 03, 1976 , age 43 y.o. , MRN: [id number] , ADDRESS: [address] [place] [id number]   05/30/2019 -   Chief Complaint  Patient presents with  . Follow-up    Patient here to go over CT and lab work results.      HPI Rickeya E Rogan 43 y.o. -returns for follow-up to review her work-up of groundglass opacities in the presence of heavy smoking.  She had a high-resolution CT chest May 06, 2019 personally visualized this.  I agree with the findings below it shows groundglass opacities.  Her autoimmune and vasculitis work-up is listed below and is normal.  I went over the history again.  She continues to smoke.  She also tells me that the sinusitis and postnasal drainage problems are severe.  Her dentist want to operate on her till pulmonary issues are sorted out.  She also reports now that the previous she lived had a lot of mold in it.  She is interested in having some kind of a work-up to sort out her groundglass opacity/ILD.    IMPRESSION: Lungs/Pleura: Mosaic pulmonary parenchymal attenuation with areas of ground-glass bilaterally. Assessment for air trapping is limited by lack of true expiration on expiratory phase imaging. No subpleural reticulation, traction bronchiectasis/bronchiolectasis, architectural distortion or honeycombing. No pleural fluid. Airway is unremarkable.   Mosaic pulmonary parenchymal attenuation with areas of ground-glass bilaterally, findings favoring subacute hypersensitivity pneumonitis. Findings are suggestive of an alternative diagnosis (not UIP) per consensus guidelines: Diagnosis of Idiopathic Pulmonary  Fibrosis: An Official ATS/ERS/JRS/ALAT Clinical Practice Guideline. Am Rosezetta Schlatter Crit Care Med Vol 198, Iss 5, [phone], Mar 17 2017.   Electronically Signed   By: Leanna Battles M.D.   On: 05/14/2019 17:00  xxxxxxxxxxxxxxxxxxxxxxxxxxxxxxxxxxxxxxxxxxxxxxxxxxxxxxxxxxxxxxxx Results for BLUE, RUGGERIO (MRN [id number]) as of 05/30/2019 11:54  Ref. Range 05/01/2019 12:53  Anti Nuclear Antibody (ANA) Latest Ref Range: NEGATIVE  NEGATIVE  Angiotensin-Converting Enzyme Latest Ref Range: 9 - 67 U/L 23  Cyclic Citrullin Peptide Ab Latest Units: UNITS <16  ds DNA Ab Latest Units: IU/mL <1  Myeloperoxidase Abs Latest Units: AI <1.0  Serine Protease 3 Latest Units: AI <1.0  RA Latex Turbid. Latest Ref Range: <14 IU/mL <14    ROS - per HPI     has a past medical history of Anxiety, Esophageal tear, and GERD (gastroesophageal reflux disease).   reports that she has been smoking cigarettes. She has a 27.00 pack-year smoking history. She has never used smokeless tobacco.  Past Surgical History:  Procedure Laterality Date  . ABDOMINAL HYSTERECTOMY    . CHOLECYSTECTOMY    . NASAL SINUS SURGERY    . TONSILLECTOMY      Allergies  Allergen Reactions  . Ondansetron Other (See Comments)    Altered mental status  . Compazine [Prochlorperazine Edisylate] Other (See Comments)    Altered mental status  . Phenergan [Promethazine Hcl] Other (See Comments)    Altered mental status  . Tape Rash     There is no immunization history on file for this patient.  No family history on file.   Current Outpatient Medications:  .  albuterol (PROVENTIL HFA;VENTOLIN HFA) 108 (90 Base) MCG/ACT inhaler, Inhale 2 puffs into the lungs every 6 (six) hours as needed for wheezing or shortness of breath., Disp: 1 Inhaler, Rfl: 0 .  esomeprazole (NEXIUM) 20 MG capsule, Take 20 mg by mouth daily at 12 noon., Disp: , Rfl:  .  ibuprofen (ADVIL,MOTRIN) 600 MG tablet, Take 1 tablet (600 mg total) by mouth  every 8 (eight) hours as needed., Disp: 30 tablet, Rfl: 0 .  multivitamin (ONE-A-DAY MEN'S) TABS tablet, Take 1 tablet by mouth daily., Disp: , Rfl:  .  methocarbamol (ROBAXIN) 500 MG tablet, 1-2 tablet qid prn muscles (Patient not taking: Reported on 05/30/2019), Disp: 20 tablet, Rfl: 0      Objective:   Vitals:   05/30/19 1124  BP: 98/72  Pulse: 88  Temp: (!) 97.3 F (36.3 C)  TempSrc: Temporal  SpO2: 99%  Weight: 178 lb (80.7 kg)  Height:  (1.575  m)    Estimated body mass index is 32.56 kg/m as calculated from the following:   Height as of this encounter: 5\' 2"  (1.575 m).   Weight as of this encounter: 178 lb (80.7 kg).  @WEIGHTCHANGE @    05/30/19 1124  Weight: 178 lb (80.7 kg)     Physical Exam Discussion only visit.         Assessment:       ICD-10-CM   1. Ground glass opacity present on imaging of lung  R91.8   2. History of smoking 25-50 pack years  Z87.891   3. Personal history of chronic sinusitis  Z87.09        Plan:     Patient Instructions     ICD-10-CM   1. Ground glass opacity present on imaging of lung  R91.8   2. History of smoking 25-50 pack years  Z87.891   3. Personal history of chronic sinusitis  Z87.09     Do bronchoscopy with small scope,  no TB risk  - for lavage. No biopsy planned Needs covid testing prior Preferred dates are   = [employer] Tuesday 06/03/2019 or Wedensday 06/04/2019,dec 3rd Thursday or dec 4th friday  late morning or early afternoon (least preferred of these are Tuesday 11/17 and dec 3rd Thursday)  Followup  - mid dec 2020 for rov of results  -   Risks of pneumothorax, hemothorax, sedation/anesthesia complications such as cardiac or respiratory arrest or hypotension, stroke and bleeding all explained. Benefits of diagnosis but limitations of non-diagnosis also explained. Patient verbalized understanding and wished to proceed.    > 50% of this > 25 min visit spent in face to face  counseling or coordination of care - by this undersigned MD - Dr Tuesday. This includes one or more of the following documented above: discussion of test results, diagnostic or treatment recommendations, prognosis, risks and benefits of management options, instructions, education, compliance or risk-factor reduction    SIGNATURE    Dr. Jan 2021, M.D., F.C.C.P,  Pulmonary and Critical Care Medicine Staff Physician, [facility] Center Director - Interstitial Lung Disease  Program  Pulmonary Fibrosis [facility] at [facility], [facility], [place]  Pager: [phone], If no answer or between  15:00h - 7:00h: call [phone] Telephone: [phone]  12:16 PM 05/30/2019

## 2019-06-02 ENCOUNTER — Telehealth: Payer: Self-pay | Admitting: Internal Medicine

## 2019-06-02 NOTE — Telephone Encounter (Signed)
Do bronchoscopy with small scope,  no TB risk  - for lavage. No biopsy planned Needs covid testing prior Preferred dates are              = [facility] Tuesday 06/03/2019 or Wedensday 06/04/2019,dec 3rd Thursday or dec 4th friday  late morning or early afternoon (least preferred of these are Tuesday 11/17 and dec 3rd Thursday)    Called resp and spoke with Joellen Jersey in regards to trying to get pt scheduled for the bronch. Asked Joellen Jersey how many days prior does a pt have to have a covid test performed and she said a covid test has to be done three days prior to a bronch. With that being said, 11/17 and 11/18 are out as it is not enough time for pt to be covid tested.  When asked Katie about 12/3 or 12/4, Joellen Jersey thought that we could get pt scheduled for the bronch 12/4 but then she said there was only going to be one RT and they have to have two RTs for bronchs.  MR, please advise other days to try to get pt scheduled for a bronch as the days you gave do not work and also keep in mind that pt needs to have covid test three days prior to scheduled bronch.

## 2019-06-02 NOTE — Telephone Encounter (Signed)
Katie returned call to speak with Raquel Sarna about scheduling pt'sd bronch. She can be reached back at [phone]

## 2019-06-05 NOTE — Telephone Encounter (Signed)
Attempted to call Anne [name] with resp with other dates to try to see if we can get pt's bronch scheduled but unable to reach. Left message for her to return call.

## 2019-06-05 NOTE — Telephone Encounter (Signed)
Katie returned my call. 12/3 is out and 12/4 is also out as they do not have any RTs on the 4th and the 3rd is booked.  12/14 is also out so the date is going to have to be 12/15 and Joellen Jersey said it was going to have to be in the morning due to already having a bronch scheduled in the afternoon.  Joellen Jersey has scheduled bronch for 12/15 at 8am.  MR, please advise if you have any issues with this and also please advise if you are going to need fluro.

## 2019-06-05 NOTE — Telephone Encounter (Signed)
I am rounding in ICU 62m in the morning. I cannot do this bronh in the morning. Please have them move it to afternoon. I think BAL is fine. No biopsy. No fluoro. Date is 07/01/2019 Tuesday afternoon (not morning)

## 2019-06-05 NOTE — Telephone Encounter (Signed)
They cannot do it in the afternoon on 12/15 as another bronch is scheduled.   If you want to contact resp in regards to seeing if you can get 12/4 worked out, their phone number is [phone]

## 2019-06-05 NOTE — Telephone Encounter (Signed)
Ok let us aim for late morning 06/19/2019 Monday. If not they really should talk to their boss and get a2nd person in for 06/20/2019   I am in 67M ICU. The other dates are 06/30/2019 Monday and 07/01/2019 Tuesday - but that is too far out.

## 2019-06-06 NOTE — Telephone Encounter (Signed)
Scheduled for 07/01/2019 at 53m. I agreed to this time due to their staffing issues

## 2019-06-06 NOTE — Telephone Encounter (Signed)
Anne [name] is there anything we need to do with this message?

## 2019-06-18 ENCOUNTER — Telehealth: Payer: Self-pay | Admitting: Internal Medicine

## 2019-06-18 NOTE — Telephone Encounter (Signed)
Call returned to patient, confirmed DOB, she is wanting to confirm the date of her Bronch, made aware is scheduled for 12/15 at 8am at [facility], she will need to arrive by 7am. NPO after midnight. Take any medications only with a sip of water. Have someone with her to drive her home. Voiced understanding.   Pt needs covid testing scheduled for [place]. Please contact pt for Covid scheduling. Thanks.   Will cc nurse so she is aware as well.

## 2019-06-19 NOTE — Telephone Encounter (Signed)
I called & got pt scheduled for Covid test at [facility] for 12/11.  She is to arrive anywhere between 8-10:30 per [facility].  Called pt & left her vm to call me back for appt info.

## 2019-06-20 NOTE — Telephone Encounter (Signed)
Holly spoke to pt & gave her covid appt info.  Nothing further needed.

## 2019-06-27 ENCOUNTER — Other Ambulatory Visit
Admission: RE | Admit: 2019-06-27 | Discharge: 2019-06-27 | Disposition: A | Discharge status: Home / Self Care | Payer: Self-pay | Source: Ambulatory Visit | Attending: Internal Medicine | Admitting: Internal Medicine

## 2019-06-27 DIAGNOSIS — Z01812 Encounter for preprocedural laboratory examination: Secondary | ICD-10-CM | POA: Insufficient documentation

## 2019-06-27 DIAGNOSIS — Z20828 Contact with and (suspected) exposure to other viral communicable diseases: Secondary | ICD-10-CM | POA: Insufficient documentation

## 2019-06-27 LAB — SARS CORONAVIRUS 2 (TAT 6-24 HRS): SARS Coronavirus 2: NEGATIVE

## 2019-06-28 ENCOUNTER — Other Ambulatory Visit (HOSPITAL_COMMUNITY): Payer: Self-pay

## 2019-07-01 ENCOUNTER — Ambulatory Visit (HOSPITAL_COMMUNITY): Admission: RE | Admit: 2019-07-01 | Payer: Self-pay | Source: Home / Self Care | Admitting: Internal Medicine

## 2019-07-01 ENCOUNTER — Inpatient Hospital Stay (HOSPITAL_COMMUNITY): Admission: RE | Admit: 2019-07-01 | Payer: Self-pay | Source: Ambulatory Visit

## 2019-07-01 ENCOUNTER — Encounter (HOSPITAL_COMMUNITY): Admission: RE | Payer: Self-pay | Source: Home / Self Care

## 2019-07-01 SURGERY — VIDEO BRONCHOSCOPY WITHOUT FLUORO
Anesthesia: Moderate Sedation | Laterality: Bilateral

## 2019-07-01 NOTE — Progress Notes (Signed)
eLink Physician-Brief Progress Note Patient Name: Anne [name] DOB: [birth year] MRN: [id number]   Date of Service  07/01/2019  HPI/Events of Note  Pt called at about 5:30 a.m. this morning to report that she has a tooth infection and spiked a fever overnight, she wanted to know if she should come in for her bronchoscopy scheduled for thi morning, she tested COVID  Negative 3 days ago.  eICU Interventions  I advised her to come in as scheduled, that her pulmonologist would likely repeat the COVID test, and that he would ultimately make the decision whether it was appropriate or not for her to proceed with the bronchoscopy.        Kerry Kass Juandedios Dudash 07/01/2019, 5:48 AM

## 2019-07-02 ENCOUNTER — Telehealth: Payer: Self-pay | Admitting: Internal Medicine

## 2019-07-02 NOTE — Telephone Encounter (Signed)
ATC, NA- line rings and then turns busy

## 2019-07-02 NOTE — Telephone Encounter (Signed)
Spoke with the pt  She states that the [facility] will no longer see her bc of the shape her lungs are in  She normally takes the highest dose of amoxicillin for 10 days  She states that in fact, she was on amox from March to Sept 2020 bc her teeth are so bad  She is worried infection will spread to her blood stream again  Requesting abx and also to know MR's thoughts on if her lungs are ok enough to get all of her teeth removed before they move forward with the bronch  Please advise thanks!

## 2019-07-02 NOTE — Telephone Encounter (Signed)
The have given augmentin 875mg  bid for cellulitis breast. I will do same for suspected tooth abscess for 10 days. However, a) if she gets worse - I am not responsible; b) if she gets worse - she has to go to ER; c) will not Rx dental or sinus issues again  And is best probably she gets her teeth removed first

## 2019-07-02 NOTE — Telephone Encounter (Signed)
Spoke with pt, states that she had to cancel bronch yesterday- woke up with an abscessed tooth that was bothering her and a temp of 103.   Pt states that she does not have a regular dentist, would like to see if MR would call her in abx to clear up the tooth abcess, so the bronch could be rescheduled.  Pharmacy: [facility] on [address].    MR please advise on abx and when to reschedule bronch.  Thanks!

## 2019-07-02 NOTE — Telephone Encounter (Signed)
What antibiotic does a dentist normally call for this issue? Dose and duration?  I thought [facility] dentist were helping her. She should really be reaching out there

## 2019-07-03 MED ORDER — AMOXICILLIN-POT CLAVULANATE 875-125 MG PO TABS: 1.0000 | ORAL_TABLET | Freq: Two times a day (BID) | ORAL | 0 refills | Status: DC

## 2019-07-03 NOTE — Telephone Encounter (Signed)
Called and spoke to pt. Informed her of the recs per MR. Augmenting sent to preferred pharmacy. Pt verbalized understanding and denied any further questions or concerns at this time.

## 2019-08-14 ENCOUNTER — Telehealth: Payer: Self-pay | Admitting: Internal Medicine

## 2019-08-14 NOTE — Telephone Encounter (Signed)
error 

## 2019-08-21 ENCOUNTER — Telehealth (INDEPENDENT_AMBULATORY_CARE_PROVIDER_SITE_OTHER): Payer: Self-pay | Admitting: Internal Medicine

## 2019-08-21 ENCOUNTER — Other Ambulatory Visit: Payer: Self-pay | Admitting: General Surgery

## 2019-08-21 ENCOUNTER — Telehealth: Payer: Self-pay | Admitting: General Surgery

## 2019-08-21 DIAGNOSIS — J209 Acute bronchitis, unspecified: Secondary | ICD-10-CM

## 2019-08-21 DIAGNOSIS — R053 Chronic cough: Secondary | ICD-10-CM

## 2019-08-21 DIAGNOSIS — R05 Cough: Secondary | ICD-10-CM

## 2019-08-21 DIAGNOSIS — Z5989 Other problems related to housing and economic circumstances: Secondary | ICD-10-CM

## 2019-08-21 DIAGNOSIS — R042 Hemoptysis: Secondary | ICD-10-CM

## 2019-08-21 DIAGNOSIS — Z87891 Personal history of nicotine dependence: Secondary | ICD-10-CM

## 2019-08-21 DIAGNOSIS — R918 Other nonspecific abnormal finding of lung field: Secondary | ICD-10-CM

## 2019-08-21 DIAGNOSIS — Z598 Other problems related to housing and economic circumstances: Secondary | ICD-10-CM

## 2019-08-21 MED ORDER — DOXYCYCLINE HYCLATE 100 MG PO TABS: 100.0000 mg | ORAL_TABLET | Freq: Two times a day (BID) | ORAL | 0 refills | Status: DC

## 2019-08-21 NOTE — Progress Notes (Signed)
Patient ID: Anne [name], female    DOB: 07/29/1975, 44 y.o.   MRN: [id number]  HPI 44 year old obese smoker self-referred.  She is uninsured.  She gets her care normally at [facility] charity care.)  Her best friend is Anne [name] who is the widow of my disease patient Anne [name] who died from he ILD, renal failure and HPS   [facility] Comprehensive ILD Questionnaire  Symptoms: Reports insidious onset of shortness of breath for the last 1 year.  There is also repeat episodic shortness of breath.  She reports no dyspnea at rest but when she takes a shower or does bathing dyspnea level is 3 and when she does household work dyspnea level is 4 walking up stairs and walking up a hill dyspnea level is 4.  There is no joint issues or any other issues other than cough she does clear the throat a lot.  Cough started years ago and actually preceded shortness of breath.  It is worse since it started it is severe nature she does cough at night and she does sometimes bring up sputum that is clear and sometimes yellow.  In addition she also has like fatigue no appetite and body aches.  She says she is lost a lot of weight.  Symptoms are moderate   Past Medical History : She has chronic recurrent sinusitis-She had apparently somebody that she has COPD but this might not be true.  She does smoke a lot.  There is a history of acid reflux and hiatal hernia since the year 2000.  She also reports occasional seizures but apparently nobody has figured out why.  In the 1990s she had mononucleosis.  She also reports recurrent pneumonias.  But she denies any heart failure or rheumatoid arthritis or collagen vascular disease.  Denies any pulmonary embolism or heart disease or pleurisy or thyroid disease or stroke or diabetes   ROS: She has fatigue for the last 6 months along with arthralgias and dry eyes and mouth but no dysphagia.  She is also lost 10 pounds and she has daily nausea with acid reflux  there is some snoring .  But no discoloration of her fingers no ulcers.  FAMILY HISTORY of LUNG DISEASE:  -Paternal grandmother with COPD and son with asthma but no pulmonary fibrosis or autoimmune disease   EXPOSURE HISTORY:  -She smokes cigarettes.  99 patient started smoking 1 pack a day.  Is a 25 pack smoking history.  She smells of tobacco through her mask.  For the last few years she does vape marijuana on an occasional basis since 2016.  She says she does one hit every 2 or 3 days.  Denies any cocaine use or intravenous drug use or electronic tobacco vaping   HOME and HOBBY DETAILS : [place].  The home is 41 years of age.  In the past she lived in a house for 10 years that had asbestos and mold in it.  Currently in the mobile home there is no shower curtain with mildew.  No fountain inside no Jacuzzi no birds or parakeets.  No feather pillow.  No contaminated mold in the [facility] duct.  Does not play any wind instruments.  Does not do any gardening.   OCCUPATIONAL HISTORY (122 questions) : Admits to living in the past in [place] condition spaces and having an asbestos exposure.  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  Has worked as a Metallurgist homes.  Rest of the history is negative   PULMONARY TOXICITY HISTORY (27 items): Negative except short) on 2 months ago she does take medicines for acid reflux    Imaging: In 2018 changed up in the ER a CT angiogram chest rule out pulmonary embolism.  I personally visualized this.  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  In 2020 August she had a CT scan of the angiogram chest at [facility]  I was able to see the images through her mobile phone.  She does have similar groundglass opacities but they are in different location particularly in the lower lobe.  She reported a history of  hemoptysis on both occasions  Simple office walk 185 feet x  3 laps goal with forehead probe 05/01/2019   O2 used ra  Number laps completed 3  Comments about pace moderate  Resting Pulse Ox/HR 99% and 84/min  Final Pulse Ox/HR 100% and 114/min  Desaturated </= 88% no  Desaturated <= 3% points no  Got Tachycardic >/= 90/min no  Symptoms at end of test dizzy  Miscellaneous comments x     Results for Anne, [name] (MRN [id number]) as of 05/01/2019 12:07  Ref. Range 07/23/2016 12:23  Eosinophils Absolute Latest Ref Range: 0 - 0.7 K/uL 0.1   Results for Anne, [name] (MRN [id number]) as of 05/01/2019 12:07  Ref. Range 07/23/2016 12:23  Creatinine Latest Ref Range: 0.44 - 1.00 mg/dL 3.15    CT chest on July 23, 2016 that I personally visualized and I agree with Dr. Debara Pickett findings below. Lungs/Pleura: Faint centrilobular ground-glass attenuation micronodularity is noted throughout the lungs bilaterally, most evident throughout the mid mid to upper lungs. In the lower lungs there is also some patchy ill-defined ground-glass attenuation which is very mild. No confluent consolidative airspace disease. No pleural effusions. No larger more suspicious appearing pulmonary nodules or masses.  Upper Abdomen: Status post cholecystectomy.  Musculoskeletal: There are no aggressive appearing lytic or blastic lesions noted in the visualized portions of the skeleton.  Review of the MIP images confirms the above findings.  IMPRESSION: 1. No evidence of pulmonary embolism. 2. Subtle pattern of centrilobular ground-glass attenuation micronodularity most evident throughout the mid to upper lungs, concerning for smoking related disease such as respiratory bronchiolitis (RB), or given the patient's symptomatology, respiratory bronchiolitis-interstitial lung disease (RB-ILD). In addition, there is some very mild patchy ground-glass attenuation throughout the mid to lower lungs  bilaterally which could also be smoking related and can be seen in the setting of desquamative interstitial pneumonia (DIP). Counseling for smoking cessation is strongly recommended.   Electronically Signed   By: Trudie Reed M.D.   On: 07/23/2016 14:08  OV 05/30/2019  Subjective:  Patient ID: Anne [name], female , DOB: 08/07/1975 , age 43 y.o. , MRN: [id number] , ADDRESS: [address] [place] [address]   05/30/2019 -   Chief Complaint  Patient presents with  . Follow-up    Patient here to go over CT and lab work results.      HPI Anne [name] 44 y.o. -returns for follow-up to review her work-up of groundglass opacities in the presence of heavy smoking.  She had a high-resolution CT chest May 06, 2019 personally visualized this.  I agree with the findings below it shows groundglass opacities.  Her  autoimmune and vasculitis work-up is listed below and is normal.  I went over the history again.  She continues to smoke.  She also tells me that the sinusitis and postnasal drainage problems are severe.  Her dentist want to operate on her till pulmonary issues are sorted out.  She also reports now that the previous she lived had a lot of mold in it.  She is interested in having some kind of a work-up to sort out her groundglass opacity/ILD.    IMPRESSION: Lungs/Pleura: Mosaic pulmonary parenchymal attenuation with areas of ground-glass bilaterally. Assessment for air trapping is limited by lack of true expiration on expiratory phase imaging. No subpleural reticulation, traction bronchiectasis/bronchiolectasis, architectural distortion or honeycombing. No pleural fluid. Airway is unremarkable.   Mosaic pulmonary parenchymal attenuation with areas of ground-glass bilaterally, findings favoring subacute hypersensitivity pneumonitis. Findings are suggestive of an alternative diagnosis (not UIP) per consensus guidelines: Diagnosis of Idiopathic Pulmonary  Fibrosis: An Official ATS/ERS/JRS/ALAT Clinical Practice Guideline. [place], Iss 5, [phone], Mar 17 2017.   Electronically Signed   By: Lorin Picket M.D.   On: 05/14/2019 17:00  xxxxxxxxxxxxxxxxxxxxxxxxxxxxxxxxxxxxxxxxxxxxxxxxxxxxxxxxxxxxxxxx Results for PIXIE, BURGENER (MRN [id number]) as of 05/30/2019 11:54  Ref. Range 05/01/2019 12:53  Anti Nuclear Antibody (ANA) Latest Ref Range: NEGATIVE  NEGATIVE  Angiotensin-Converting Enzyme Latest Ref Range: 9 - 67 U/L 23  Cyclic Citrullin Peptide Ab Latest Units: UNITS <16  ds DNA Ab Latest Units: IU/mL <1  Myeloperoxidase Abs Latest Units: AI <1.0  Serine Protease 3 Latest Units: AI <1.0  RA Latex Turbid. Latest Ref Range: <14 IU/mL <14    OV 08/21/2019  Subjective:  Patient ID: Anne [name], female , DOB: 03/11/1976 , age 35 y.o. , MRN: [id number] , ADDRESS: [place] [address]   08/21/2019 -   Chief Complaint  Patient presents with  . Follow-up    Ground glass opacity present on imaging of lung. Patient feels breathing has gotten worse. Had had productive cough with some blood seen.     Ground glass opacity present on imaging of lung History of smoking 25-50 pack years Chronic cough Hemoptysis Under or uninsured   HPI Anne [name] 43 y.o. -on the video visit for this interview.  And visit.  Identified with 2 person identified.  Risks, benefits and limitations of video visit were explained.  She was supposed undergo bronchoscopy for the above issues in mid December 2020 but she canceled because of fever.  She tells me she had a dental abscess at that time.  Then later she also called in for Augmentin for left breast cellulitis because nobody could help her out.  That was her last antibiotic.  She is now complaining of persistent cough with mucus mixed with blood this is ongoing.  Associated with left infra mammary pain when she coughs that is intermittent happens at  rest.  She says she is under insured/uninsured.  She has [employer] care.  She says she is not able to afford visiting with [place] at [facility] anymore.  She is wondering if she should just use the [facility] care and start seeing the [facility] pulmonologist.  She needs a referral for this.  I am extremely supportive of this.  At this point in time she says she has had some deterioration with the mucus in the last few to several days.  She is in tears.  She says she is applied for  Medicaid but got rejected because she did not have anyone below 18 living with her.  And she is not disabled.     ROS - per HPI     has a past medical history of Anxiety, Esophageal tear, and GERD (gastroesophageal reflux disease).   reports that she has been smoking cigarettes. She has a 27.00 pack-year smoking history. She has never used smokeless tobacco.  Past Surgical History:  Procedure Laterality Date  . ABDOMINAL HYSTERECTOMY    . CHOLECYSTECTOMY    . NASAL SINUS SURGERY    . TONSILLECTOMY      Allergies  Allergen Reactions  . Ondansetron Other (See Comments)    Altered mental status  . Compazine [Prochlorperazine Edisylate] Other (See Comments)    Altered mental status  . Metoclopramide Other (See Comments)    Altered mental status.  Betsey Amen [Promethazine Hcl] Other (See Comments)    Altered mental status  . Tape Rash     There is no immunization history on file for this patient.  No family history on file.   Current Outpatient Medications:  .  acetaminophen (TYLENOL) 500 MG tablet, Take 1,000 mg by mouth every 6 (six) hours as needed for moderate pain or headache., Disp: , Rfl:  .  albuterol (VENTOLIN HFA) 108 (90 Base) MCG/ACT inhaler, Inhale 2 puffs into the lungs every 6 (six) hours as needed for wheezing or shortness of breath., Disp: 18 g, Rfl: 5 .  esomeprazole (NEXIUM) 20 MG capsule, Take 20 mg by mouth 2 (two) times daily. , Disp: , Rfl:  .  famotidine (PEPCID AC  MAXIMUM STRENGTH) 20 MG tablet, Take 20 mg by mouth daily as needed for heartburn or indigestion., Disp: , Rfl:  .  Multiple Vitamin (MULTIVITAMIN WITH MINERALS) TABS tablet, Take 1 tablet by mouth daily., Disp: , Rfl:  .  amoxicillin-clavulanate (AUGMENTIN) 875-125 MG tablet, Take 1 tablet by mouth 2 (two) times daily. (Patient not taking: Reported on 08/21/2019), Disp: 20 tablet, Rfl: 0      Objective:   There were no vitals filed for this visit.  Estimated body mass index is 32.56 kg/m as calculated from the following:   Height as of 05/30/19: 5\' 2"  (1.575 m).   Weight as of 05/30/19: 178 lb (80.7 kg).  @WEIGHTCHANGE @  There were no vitals filed for this visit.   Physical Exam - In tears on video        Assessment:       ICD-10-CM   1. Acute bronchitis, unspecified organism  J20.9   2. Ground glass opacity present on imaging of lung  R91.8   3. History of smoking 25-50 pack years  Z87.891   4. Chronic cough  R05   5. Hemoptysis  R04.2   6. Under or uninsured  Z59.8   Acute bronchitis is new     Plan:     Patient Instructions  Acute bronchitis, unspecified organism   Plan - Take doxycycline 100mg  po twice daily x 5 days; take after meals and avoid sunlight   Ground glass opacity present on imaging of lung History of smoking 25-50 pack years Chronic cough Hemoptysis Underinsured/Uninsuired - on [facility] care   -understand not able to afford care with at [facility]  Plan  - refer [facility] - Dr [place] and group     SIGNATURE    Dr. [facility], M.D., F.C.C.P,  Pulmonary and Critical Care Medicine Staff Physician, [facility] Director -  Interstitial Lung Disease  Program  Pulmonary Fibrosis [facility] at [facility] [place], [place], [address]  Pager: [phone], If no answer or between  15:00h - 7:00h: call [phone] Telephone: [phone]  12:26 PM 08/21/2019

## 2019-08-21 NOTE — Patient Instructions (Signed)
Acute bronchitis, unspecified organism   Plan - Take doxycycline 100mg  po twice daily x 5 days; take after meals and avoid sunlight   Ground glass opacity present on imaging of lung History of smoking 25-50 pack years Chronic cough Hemoptysis Underinsured/Uninsuired - on [facility] care   -understand not able to afford care with [facility] at [facility]  - refer [facility]monary - Dr [facility] and group

## 2019-08-21 NOTE — Telephone Encounter (Signed)
Per chart, patient is uninsured. [employer] offers patient assistance for Ventolin inhaler. Only requirement is patient must fall within program income guidelines. Other options would be a pharmacy discount card.  4:14 PM Dorthula Nettles, CPhT

## 2019-08-21 NOTE — Telephone Encounter (Signed)
This patient had a video visit with Dr. Marchelle Gearing this morning. When I was going over her medications she stated she has not used her inhaler because she cannot afford to get it filled.  Is there a program that would be able to help her to cover the total cost, or provide at a discounted price?

## 2019-08-27 NOTE — Telephone Encounter (Signed)
Dr. Marchelle Gearing, info below sent to you as an fyi:  I called the patient to make her aware of there response received from pharmacy.  She wanted to thank you for the referral to [facility]. Patient said the visit today was great and hey are able to get her the medication for free.

## 2019-08-29 NOTE — Telephone Encounter (Signed)
Thanks you Annabelle Harman. Will close message

## 2020-08-06 IMAGING — CT CT CHEST HIGH RESOLUTION W/O CM
3 of 13 series · 10 of 36 positions shown, 12 images · non-contrast
Comparison: 07/23/2016.

CLINICAL DATA: Chronic shortness of breath. Intermittent
hemoptysis.

EXAM:
CT CHEST WITHOUT CONTRAST
TECHNIQUE: Multidetector CT imaging of the chest was performed following the
standard protocol without intravenous contrast. High resolution
imaging of the lungs, as well as inspiratory and expiratory imaging,
was performed.

[Series 5: pronethorax 2.00 cor · coronal · 0.62mm/px · 1 of 149 slices shown]
[im 75/149  lung]
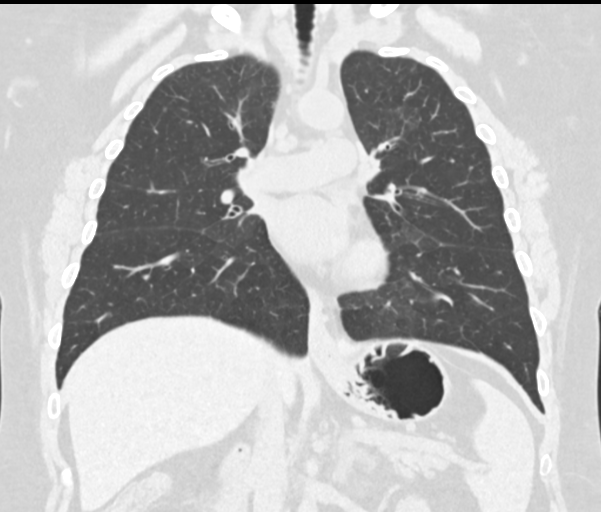

[Series 12: high res (id) pronethorax 1.00 ax · axial · 0.73mm/px · z∈[-1280,-1069]mm · 5 of 317 slices shown, 7 images]
[im 53/317  mediastinal]
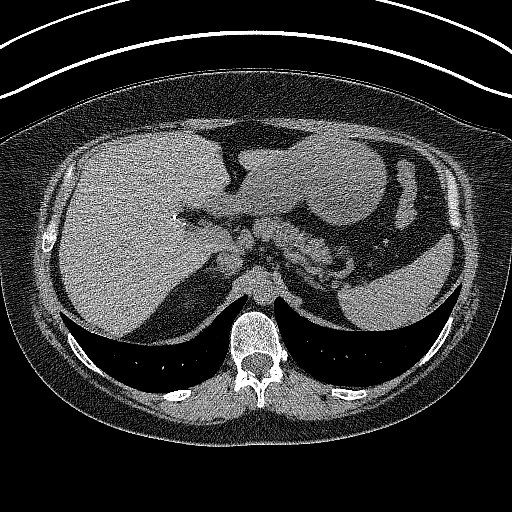
[im 53/317  lung]
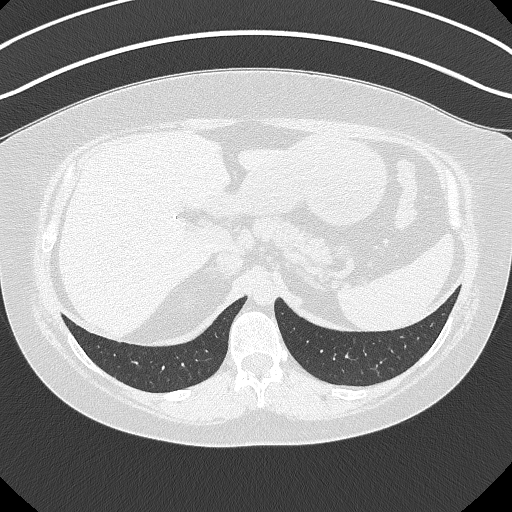
[im 106/317  lung]
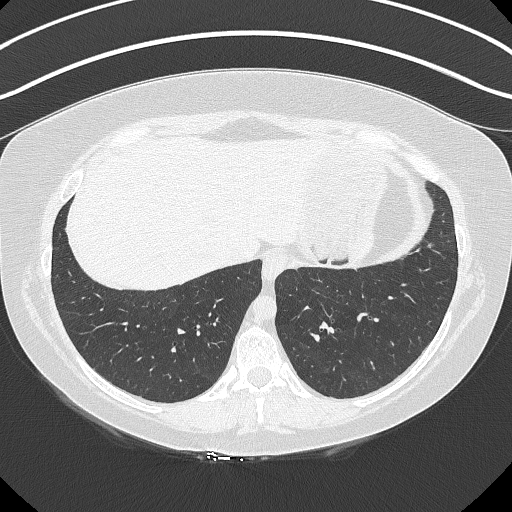
[im 159/317  lung]
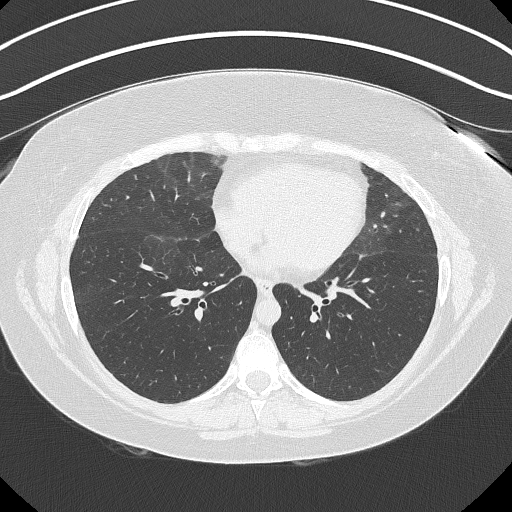
[im 211/317  lung]
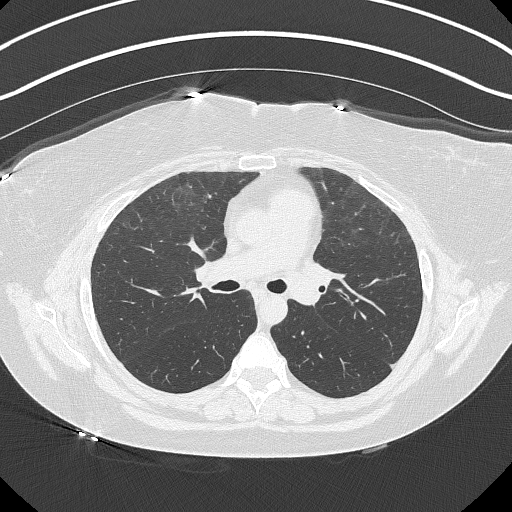
[im 264/317  mediastinal]
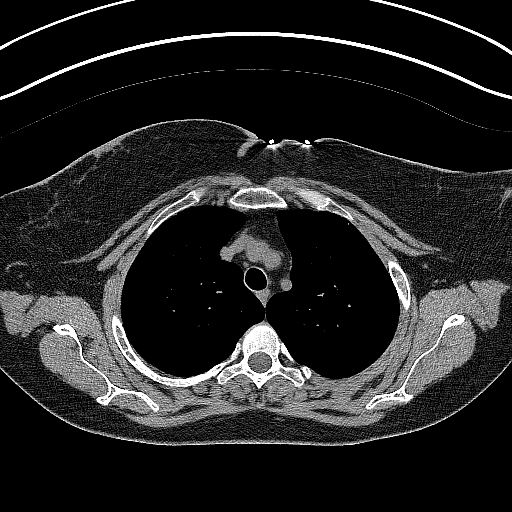
[im 264/317  lung]
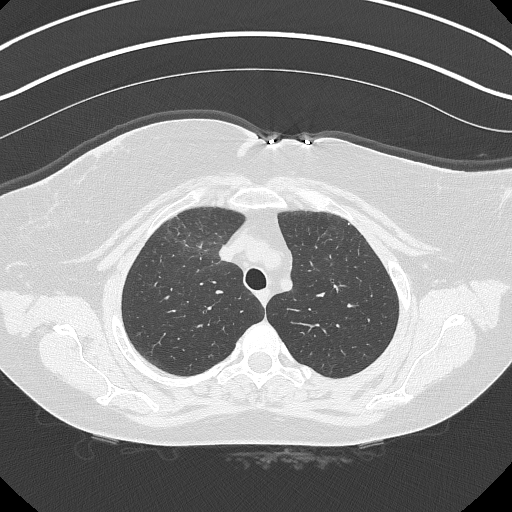

[Series 24: high res (id) thorax 1.00 ax · axial · 0.72mm/px · z∈[-1311,-1142]mm · 4 of 283 slices shown]
[im 57/283  lung]
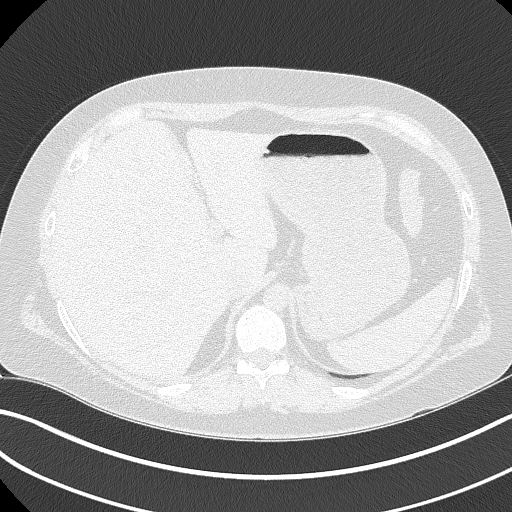
[im 113/283  lung]
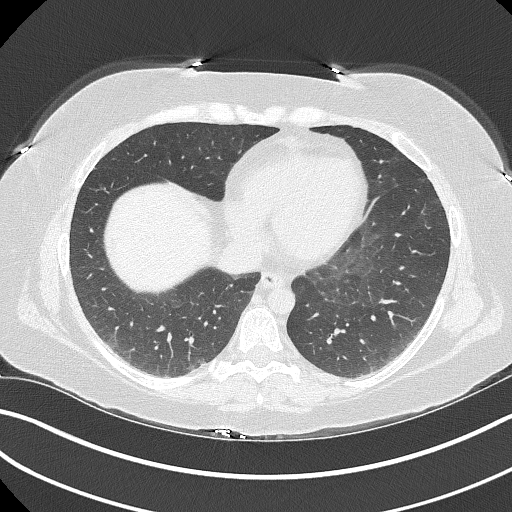
[im 170/283  lung]
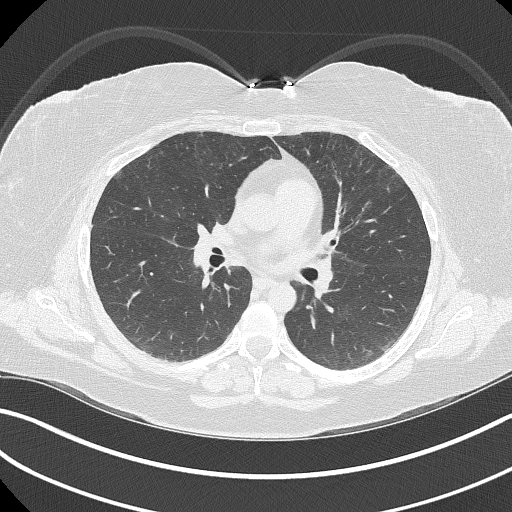
[im 226/283  lung]
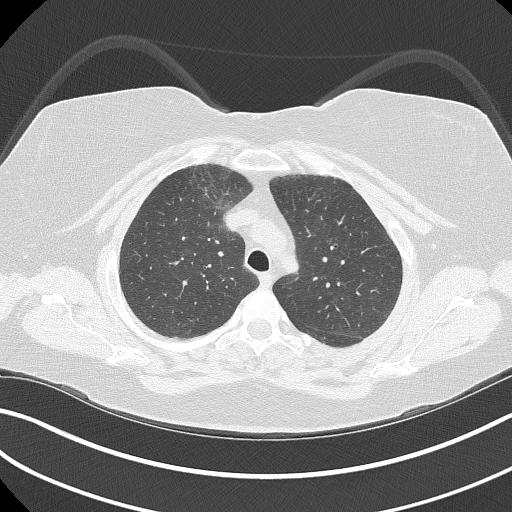

[10 of 36 positions shown; findings below may reference images not displayed]

FINDINGS: Cardiovascular: Heart size normal.  No pericardial effusion.

Mediastinum/Nodes: Mediastinal lymph nodes are not enlarged by CT
size criteria. Hilar regions are difficult to definitively evaluate
without IV contrast but appear grossly unremarkable. No axillary
adenopathy. Esophagus is grossly unremarkable.

Lungs/Pleura: Mosaic pulmonary parenchymal attenuation with areas of
ground-glass bilaterally. Assessment for air trapping is limited by
lack of true expiration on expiratory phase imaging. No subpleural
reticulation, traction bronchiectasis/bronchiolectasis,
architectural distortion or honeycombing. No pleural fluid. Airway
is unremarkable.

Upper Abdomen: Visualized portions of the liver, adrenal glands,
kidneys, spleen, pancreas, stomach and bowel are grossly
unremarkable. Cholecystectomy. No upper abdominal adenopathy.

Musculoskeletal: No worrisome lytic or sclerotic lesions.
IMPRESSION: Mosaic pulmonary parenchymal attenuation with areas of ground-glass
bilaterally, findings favoring subacute hypersensitivity
pneumonitis. Findings are suggestive of an alternative diagnosis
(not UIP) per consensus guidelines: Diagnosis of Idiopathic
Pulmonary Fibrosis: An Official ATS/ERS/JRS/ALAT Clinical Practice
Guideline. Am J Respir Crit Care Med Vol 198, [place]ack Tiger 5, ppe11-e[DATE]

## 2022-02-27 NOTE — Telephone Encounter (Signed)
Seems like encounter was open in error so closing encounter.  

## 2022-08-04 DIAGNOSIS — R1013 Epigastric pain: Secondary | ICD-10-CM | POA: Diagnosis not present

## 2022-08-04 DIAGNOSIS — K921 Melena: Secondary | ICD-10-CM | POA: Diagnosis not present

## 2022-08-04 DIAGNOSIS — R197 Diarrhea, unspecified: Secondary | ICD-10-CM | POA: Diagnosis not present

## 2022-08-04 DIAGNOSIS — F419 Anxiety disorder, unspecified: Secondary | ICD-10-CM | POA: Diagnosis not present

## 2022-08-04 DIAGNOSIS — F32A Depression, unspecified: Secondary | ICD-10-CM | POA: Diagnosis not present

## 2022-08-25 DIAGNOSIS — Z79899 Other long term (current) drug therapy: Secondary | ICD-10-CM | POA: Diagnosis not present

## 2022-08-25 DIAGNOSIS — Z5181 Encounter for therapeutic drug level monitoring: Secondary | ICD-10-CM | POA: Diagnosis not present

## 2022-08-25 DIAGNOSIS — F411 Generalized anxiety disorder: Secondary | ICD-10-CM | POA: Diagnosis not present

## 2022-08-25 DIAGNOSIS — F410 Panic disorder [episodic paroxysmal anxiety] without agoraphobia: Secondary | ICD-10-CM | POA: Diagnosis not present

## 2022-09-13 DIAGNOSIS — F411 Generalized anxiety disorder: Secondary | ICD-10-CM | POA: Diagnosis not present

## 2022-09-13 DIAGNOSIS — Z79899 Other long term (current) drug therapy: Secondary | ICD-10-CM | POA: Diagnosis not present

## 2022-09-13 DIAGNOSIS — F410 Panic disorder [episodic paroxysmal anxiety] without agoraphobia: Secondary | ICD-10-CM | POA: Diagnosis not present

## 2022-09-13 DIAGNOSIS — Z5181 Encounter for therapeutic drug level monitoring: Secondary | ICD-10-CM | POA: Diagnosis not present

## 2022-09-15 ENCOUNTER — Other Ambulatory Visit: Payer: Self-pay | Admitting: Student

## 2022-09-15 DIAGNOSIS — Z1231 Encounter for screening mammogram for malignant neoplasm of breast: Secondary | ICD-10-CM

## 2022-09-19 DIAGNOSIS — D3132 Benign neoplasm of left choroid: Secondary | ICD-10-CM | POA: Diagnosis not present

## 2022-09-19 DIAGNOSIS — H40003 Preglaucoma, unspecified, bilateral: Secondary | ICD-10-CM | POA: Diagnosis not present

## 2022-09-22 DIAGNOSIS — R042 Hemoptysis: Secondary | ICD-10-CM | POA: Diagnosis not present

## 2022-09-22 DIAGNOSIS — F32A Depression, unspecified: Secondary | ICD-10-CM | POA: Diagnosis not present

## 2022-09-22 DIAGNOSIS — R569 Unspecified convulsions: Secondary | ICD-10-CM | POA: Diagnosis not present

## 2022-09-22 DIAGNOSIS — E785 Hyperlipidemia, unspecified: Secondary | ICD-10-CM | POA: Diagnosis not present

## 2022-09-22 DIAGNOSIS — F419 Anxiety disorder, unspecified: Secondary | ICD-10-CM | POA: Diagnosis not present

## 2022-09-22 DIAGNOSIS — M797 Fibromyalgia: Secondary | ICD-10-CM | POA: Diagnosis not present

## 2022-09-25 DIAGNOSIS — H5213 Myopia, bilateral: Secondary | ICD-10-CM | POA: Diagnosis not present

## 2022-10-11 DIAGNOSIS — F410 Panic disorder [episodic paroxysmal anxiety] without agoraphobia: Secondary | ICD-10-CM | POA: Diagnosis not present

## 2022-10-11 DIAGNOSIS — F411 Generalized anxiety disorder: Secondary | ICD-10-CM | POA: Diagnosis not present

## 2022-10-25 DIAGNOSIS — H401232 Low-tension glaucoma, bilateral, moderate stage: Secondary | ICD-10-CM | POA: Diagnosis not present

## 2022-10-31 DIAGNOSIS — H524 Presbyopia: Secondary | ICD-10-CM | POA: Diagnosis not present

## 2022-11-24 DIAGNOSIS — F411 Generalized anxiety disorder: Secondary | ICD-10-CM | POA: Diagnosis not present

## 2022-11-24 DIAGNOSIS — F410 Panic disorder [episodic paroxysmal anxiety] without agoraphobia: Secondary | ICD-10-CM | POA: Diagnosis not present

## 2022-12-06 DIAGNOSIS — Z5181 Encounter for therapeutic drug level monitoring: Secondary | ICD-10-CM | POA: Diagnosis not present

## 2022-12-06 DIAGNOSIS — Z79899 Other long term (current) drug therapy: Secondary | ICD-10-CM | POA: Diagnosis not present

## 2022-12-12 DIAGNOSIS — K2100 Gastro-esophageal reflux disease with esophagitis, without bleeding: Secondary | ICD-10-CM | POA: Diagnosis not present

## 2022-12-12 DIAGNOSIS — R1115 Cyclical vomiting syndrome unrelated to migraine: Secondary | ICD-10-CM | POA: Diagnosis not present

## 2022-12-12 DIAGNOSIS — F32A Depression, unspecified: Secondary | ICD-10-CM | POA: Diagnosis not present

## 2022-12-12 DIAGNOSIS — F419 Anxiety disorder, unspecified: Secondary | ICD-10-CM | POA: Diagnosis not present

## 2022-12-12 DIAGNOSIS — R569 Unspecified convulsions: Secondary | ICD-10-CM | POA: Diagnosis not present

## 2022-12-13 DIAGNOSIS — S5001XA Contusion of right elbow, initial encounter: Secondary | ICD-10-CM | POA: Diagnosis not present

## 2022-12-22 DIAGNOSIS — F410 Panic disorder [episodic paroxysmal anxiety] without agoraphobia: Secondary | ICD-10-CM | POA: Diagnosis not present

## 2022-12-22 DIAGNOSIS — F411 Generalized anxiety disorder: Secondary | ICD-10-CM | POA: Diagnosis not present

## 2023-01-05 DIAGNOSIS — D3132 Benign neoplasm of left choroid: Secondary | ICD-10-CM | POA: Diagnosis not present

## 2023-01-05 DIAGNOSIS — H401232 Low-tension glaucoma, bilateral, moderate stage: Secondary | ICD-10-CM | POA: Diagnosis not present

## 2023-03-09 DIAGNOSIS — F411 Generalized anxiety disorder: Secondary | ICD-10-CM | POA: Diagnosis not present

## 2023-03-09 DIAGNOSIS — F410 Panic disorder [episodic paroxysmal anxiety] without agoraphobia: Secondary | ICD-10-CM | POA: Diagnosis not present

## 2023-03-09 DIAGNOSIS — Z5181 Encounter for therapeutic drug level monitoring: Secondary | ICD-10-CM | POA: Diagnosis not present

## 2023-05-18 DIAGNOSIS — F410 Panic disorder [episodic paroxysmal anxiety] without agoraphobia: Secondary | ICD-10-CM | POA: Diagnosis not present

## 2023-05-18 DIAGNOSIS — Z5181 Encounter for therapeutic drug level monitoring: Secondary | ICD-10-CM | POA: Diagnosis not present

## 2023-05-18 DIAGNOSIS — F411 Generalized anxiety disorder: Secondary | ICD-10-CM | POA: Diagnosis not present

## 2023-07-05 DIAGNOSIS — F411 Generalized anxiety disorder: Secondary | ICD-10-CM | POA: Diagnosis not present

## 2023-07-05 DIAGNOSIS — F410 Panic disorder [episodic paroxysmal anxiety] without agoraphobia: Secondary | ICD-10-CM | POA: Diagnosis not present

## 2023-07-07 DIAGNOSIS — S8254XA Nondisplaced fracture of medial malleolus of right tibia, initial encounter for closed fracture: Secondary | ICD-10-CM | POA: Diagnosis not present

## 2023-07-07 DIAGNOSIS — S8261XA Displaced fracture of lateral malleolus of right fibula, initial encounter for closed fracture: Secondary | ICD-10-CM | POA: Diagnosis not present

## 2023-07-17 DIAGNOSIS — S8261XA Displaced fracture of lateral malleolus of right fibula, initial encounter for closed fracture: Secondary | ICD-10-CM | POA: Diagnosis not present

## 2023-07-27 DIAGNOSIS — S83411A Sprain of medial collateral ligament of right knee, initial encounter: Secondary | ICD-10-CM | POA: Diagnosis not present

## 2023-07-27 DIAGNOSIS — S8261XA Displaced fracture of lateral malleolus of right fibula, initial encounter for closed fracture: Secondary | ICD-10-CM | POA: Diagnosis not present

## 2023-08-08 DIAGNOSIS — F410 Panic disorder [episodic paroxysmal anxiety] without agoraphobia: Secondary | ICD-10-CM | POA: Diagnosis not present

## 2023-08-08 DIAGNOSIS — F411 Generalized anxiety disorder: Secondary | ICD-10-CM | POA: Diagnosis not present

## 2023-08-17 DIAGNOSIS — S83411A Sprain of medial collateral ligament of right knee, initial encounter: Secondary | ICD-10-CM | POA: Diagnosis not present

## 2023-08-17 DIAGNOSIS — S8261XA Displaced fracture of lateral malleolus of right fibula, initial encounter for closed fracture: Secondary | ICD-10-CM | POA: Diagnosis not present

## 2023-09-05 DIAGNOSIS — F411 Generalized anxiety disorder: Secondary | ICD-10-CM | POA: Diagnosis not present

## 2023-09-05 DIAGNOSIS — F410 Panic disorder [episodic paroxysmal anxiety] without agoraphobia: Secondary | ICD-10-CM | POA: Diagnosis not present

## 2023-09-16 DIAGNOSIS — F17210 Nicotine dependence, cigarettes, uncomplicated: Secondary | ICD-10-CM | POA: Diagnosis not present

## 2023-09-16 DIAGNOSIS — G8929 Other chronic pain: Secondary | ICD-10-CM | POA: Diagnosis not present

## 2023-09-16 DIAGNOSIS — Z79899 Other long term (current) drug therapy: Secondary | ICD-10-CM | POA: Diagnosis not present

## 2023-09-16 DIAGNOSIS — Z888 Allergy status to other drugs, medicaments and biological substances status: Secondary | ICD-10-CM | POA: Diagnosis not present

## 2023-09-16 DIAGNOSIS — R55 Syncope and collapse: Secondary | ICD-10-CM | POA: Diagnosis not present

## 2023-09-16 DIAGNOSIS — S8261XA Displaced fracture of lateral malleolus of right fibula, initial encounter for closed fracture: Secondary | ICD-10-CM | POA: Diagnosis not present

## 2023-09-16 DIAGNOSIS — J449 Chronic obstructive pulmonary disease, unspecified: Secondary | ICD-10-CM | POA: Diagnosis not present

## 2023-09-16 DIAGNOSIS — K219 Gastro-esophageal reflux disease without esophagitis: Secondary | ICD-10-CM | POA: Diagnosis not present

## 2023-09-16 DIAGNOSIS — M25571 Pain in right ankle and joints of right foot: Secondary | ICD-10-CM | POA: Diagnosis not present

## 2023-09-16 DIAGNOSIS — G44309 Post-traumatic headache, unspecified, not intractable: Secondary | ICD-10-CM | POA: Diagnosis not present

## 2023-09-16 DIAGNOSIS — F32A Depression, unspecified: Secondary | ICD-10-CM | POA: Diagnosis not present

## 2023-09-16 DIAGNOSIS — Z043 Encounter for examination and observation following other accident: Secondary | ICD-10-CM | POA: Diagnosis not present

## 2023-09-16 DIAGNOSIS — F419 Anxiety disorder, unspecified: Secondary | ICD-10-CM | POA: Diagnosis not present

## 2023-09-16 DIAGNOSIS — M797 Fibromyalgia: Secondary | ICD-10-CM | POA: Diagnosis not present

## 2023-09-17 DIAGNOSIS — R9431 Abnormal electrocardiogram [ECG] [EKG]: Secondary | ICD-10-CM | POA: Diagnosis not present

## 2023-09-17 DIAGNOSIS — M25571 Pain in right ankle and joints of right foot: Secondary | ICD-10-CM | POA: Diagnosis not present

## 2023-09-17 DIAGNOSIS — Z043 Encounter for examination and observation following other accident: Secondary | ICD-10-CM | POA: Diagnosis not present

## 2023-09-17 DIAGNOSIS — S8261XA Displaced fracture of lateral malleolus of right fibula, initial encounter for closed fracture: Secondary | ICD-10-CM | POA: Diagnosis not present

## 2023-09-17 DIAGNOSIS — G8929 Other chronic pain: Secondary | ICD-10-CM | POA: Diagnosis not present

## 2023-10-05 ENCOUNTER — Emergency Department
Admission: EM | Admit: 2023-10-05 | Discharge: 2023-10-06 | Disposition: A | Discharge status: Home / Self Care | Attending: Emergency Medicine | Admitting: Emergency Medicine

## 2023-10-05 ENCOUNTER — Other Ambulatory Visit: Payer: Self-pay

## 2023-10-05 DIAGNOSIS — S6992XA Unspecified injury of left wrist, hand and finger(s), initial encounter: Secondary | ICD-10-CM | POA: Diagnosis present

## 2023-10-05 DIAGNOSIS — Z23 Encounter for immunization: Secondary | ICD-10-CM | POA: Diagnosis not present

## 2023-10-05 DIAGNOSIS — R45851 Suicidal ideations: Secondary | ICD-10-CM | POA: Insufficient documentation

## 2023-10-05 DIAGNOSIS — X781XXA Intentional self-harm by knife, initial encounter: Secondary | ICD-10-CM | POA: Insufficient documentation

## 2023-10-05 DIAGNOSIS — S61512A Laceration without foreign body of left wrist, initial encounter: Secondary | ICD-10-CM | POA: Diagnosis not present

## 2023-10-05 DIAGNOSIS — R9431 Abnormal electrocardiogram [ECG] [EKG]: Secondary | ICD-10-CM | POA: Diagnosis not present

## 2023-10-05 DIAGNOSIS — F339 Major depressive disorder, recurrent, unspecified: Secondary | ICD-10-CM | POA: Insufficient documentation

## 2023-10-05 DIAGNOSIS — F411 Generalized anxiety disorder: Secondary | ICD-10-CM | POA: Insufficient documentation

## 2023-10-05 DIAGNOSIS — X838XXA Intentional self-harm by other specified means, initial encounter: Secondary | ICD-10-CM

## 2023-10-05 DIAGNOSIS — S51812A Laceration without foreign body of left forearm, initial encounter: Secondary | ICD-10-CM | POA: Diagnosis not present

## 2023-10-05 LAB — CBC
HCT: 35.6 % — ABNORMAL LOW (ref 36.0–46.0)
Hemoglobin: 12 g/dL (ref 12.0–15.0)
MCH: 32.8 pg (ref 26.0–34.0)
MCHC: 33.7 g/dL (ref 30.0–36.0)
MCV: 97.3 fL (ref 80.0–100.0)
Platelets: 207 10*3/uL (ref 150–400)
RBC: 3.66 MIL/uL — ABNORMAL LOW (ref 3.87–5.11)
RDW: 12.9 % (ref 11.5–15.5)
WBC: 7 10*3/uL (ref 4.0–10.5)
nRBC: 0 % (ref 0.0–0.2)

## 2023-10-05 LAB — COMPREHENSIVE METABOLIC PANEL
ALT: 14 U/L (ref 0–44)
AST: 14 U/L — ABNORMAL LOW (ref 15–41)
Albumin: 3.5 g/dL (ref 3.5–5.0)
Alkaline Phosphatase: 60 U/L (ref 38–126)
Anion gap: 8 (ref 5–15)
BUN: 11 mg/dL (ref 6–20)
CO2: 21 mmol/L — ABNORMAL LOW (ref 22–32)
Calcium: 8.6 mg/dL — ABNORMAL LOW (ref 8.9–10.3)
Chloride: 110 mmol/L (ref 98–111)
Creatinine, Ser: 0.97 mg/dL (ref 0.44–1.00)
GFR, Estimated: >60 mL/min (ref >60)
Glucose, Bld: 84 mg/dL (ref 70–99)
Potassium: 4.1 mmol/L (ref 3.5–5.1)
Sodium: 139 mmol/L (ref 135–145)
Total Bilirubin: 0.4 mg/dL (ref 0.0–1.2)
Total Protein: 6.2 g/dL — ABNORMAL LOW (ref 6.5–8.1)

## 2023-10-05 LAB — ETHANOL: Alcohol, Ethyl (B): <10 mg/dL (ref <10)

## 2023-10-05 LAB — SALICYLATE LEVEL: Salicylate Lvl: <7 mg/dL — ABNORMAL LOW (ref 7.0–30.0)

## 2023-10-05 LAB — ACETAMINOPHEN LEVEL: Acetaminophen (Tylenol), Serum: 12 ug/mL (ref 10–30)

## 2023-10-05 MED ORDER — LIDOCAINE HCL (PF) 1 % IJ SOLN
5.0000 mL | Freq: Once | INTRAMUSCULAR | Status: AC
  Administered 2023-10-06: 5 mL
  Filled 2023-10-05: qty 5

## 2023-10-05 MED ORDER — TETANUS-DIPHTH-ACELL PERTUSSIS 5-2.5-18.5 LF-MCG/0.5 IM SUSY
0.5000 mL | PREFILLED_SYRINGE | Freq: Once | INTRAMUSCULAR | Status: AC
  Administered 2023-10-06: 0.5 mL via INTRAMUSCULAR
  Filled 2023-10-05: qty 0.5
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
## 2023-10-05 NOTE — ED Notes (Signed)
 Pt ask to use the phone to call her mom, explained to pt this area has dedicated times to be able to use the phone. Pt got upset and said "her family needs to know she is here", advised pt is is on a psych hold and can use the phone during the next phone interval time which will be in the morning.

## 2023-10-05 NOTE — ED Notes (Signed)
 [facility] called and they stated they will schedule a telepsych interview.
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
## 2023-10-05 NOTE — ED Notes (Signed)
 Pt declined evening snack, advised she does not have her dentures with her, ask for water, provided.
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
## 2023-10-06 ENCOUNTER — Inpatient Hospital Stay (HOSPITAL_COMMUNITY)
Admission: AD | Admit: 2023-10-06 | Discharge: 2023-10-12 | DRG: 885 | Disposition: A | Discharge status: Home / Self Care | Source: Intra-hospital | Attending: Psychiatry | Admitting: Psychiatry

## 2023-10-06 ENCOUNTER — Encounter: Payer: Self-pay | Admitting: Psychiatry

## 2023-10-06 DIAGNOSIS — Z9181 History of falling: Secondary | ICD-10-CM

## 2023-10-06 DIAGNOSIS — Z79899 Other long term (current) drug therapy: Secondary | ICD-10-CM | POA: Diagnosis not present

## 2023-10-06 DIAGNOSIS — K5900 Constipation, unspecified: Secondary | ICD-10-CM | POA: Diagnosis not present

## 2023-10-06 DIAGNOSIS — Z716 Tobacco abuse counseling: Secondary | ICD-10-CM

## 2023-10-06 DIAGNOSIS — F411 Generalized anxiety disorder: Secondary | ICD-10-CM

## 2023-10-06 DIAGNOSIS — Z8679 Personal history of other diseases of the circulatory system: Secondary | ICD-10-CM

## 2023-10-06 DIAGNOSIS — Z801 Family history of malignant neoplasm of trachea, bronchus and lung: Secondary | ICD-10-CM

## 2023-10-06 DIAGNOSIS — Z818 Family history of other mental and behavioral disorders: Secondary | ICD-10-CM | POA: Diagnosis not present

## 2023-10-06 DIAGNOSIS — Z90710 Acquired absence of both cervix and uterus: Secondary | ICD-10-CM

## 2023-10-06 DIAGNOSIS — G4700 Insomnia, unspecified: Secondary | ICD-10-CM | POA: Diagnosis not present

## 2023-10-06 DIAGNOSIS — M797 Fibromyalgia: Secondary | ICD-10-CM | POA: Diagnosis present

## 2023-10-06 DIAGNOSIS — F17210 Nicotine dependence, cigarettes, uncomplicated: Secondary | ICD-10-CM | POA: Diagnosis present

## 2023-10-06 DIAGNOSIS — X838XXA Intentional self-harm by other specified means, initial encounter: Secondary | ICD-10-CM

## 2023-10-06 DIAGNOSIS — S61512A Laceration without foreign body of left wrist, initial encounter: Secondary | ICD-10-CM | POA: Diagnosis not present

## 2023-10-06 DIAGNOSIS — Z886 Allergy status to analgesic agent status: Secondary | ICD-10-CM | POA: Diagnosis not present

## 2023-10-06 DIAGNOSIS — F339 Major depressive disorder, recurrent, unspecified: Secondary | ICD-10-CM

## 2023-10-06 DIAGNOSIS — F1220 Cannabis dependence, uncomplicated: Secondary | ICD-10-CM | POA: Diagnosis present

## 2023-10-06 DIAGNOSIS — Z888 Allergy status to other drugs, medicaments and biological substances status: Secondary | ICD-10-CM

## 2023-10-06 DIAGNOSIS — I10 Essential (primary) hypertension: Secondary | ICD-10-CM | POA: Diagnosis present

## 2023-10-06 DIAGNOSIS — Z91048 Other nonmedicinal substance allergy status: Secondary | ICD-10-CM

## 2023-10-06 DIAGNOSIS — F332 Major depressive disorder, recurrent severe without psychotic features: Principal | ICD-10-CM | POA: Diagnosis present

## 2023-10-06 LAB — URINE DRUG SCREEN, QUALITATIVE (ARMC ONLY)
Amphetamines, Ur Screen: NOT DETECTED
Barbiturates, Ur Screen: NOT DETECTED
Benzodiazepine, Ur Scrn: POSITIVE — AB
Cannabinoid 50 Ng, Ur ~~LOC~~: POSITIVE — AB
Cocaine Metabolite,Ur ~~LOC~~: NOT DETECTED
MDMA (Ecstasy)Ur Screen: NOT DETECTED
Methadone Scn, Ur: NOT DETECTED
Opiate, Ur Screen: NOT DETECTED
Phencyclidine (PCP) Ur S: NOT DETECTED
Tricyclic, Ur Screen: POSITIVE — AB

## 2023-10-06 LAB — POC URINE PREG, ED: Preg Test, Ur: NEGATIVE

## 2023-10-06 MED ORDER — TRAZODONE HCL 150 MG PO TABS
150.0000 mg | ORAL_TABLET | Freq: Every day | ORAL | Status: DC
  Administered 2023-10-07: 150 mg via ORAL
  Filled 2023-10-06 (×3): qty 1

## 2023-10-06 MED ORDER — QUETIAPINE FUMARATE 50 MG PO TABS
50.0000 mg | ORAL_TABLET | Freq: Every day | ORAL | Status: DC
Start: 2023-10-06 — End: 2023-10-07
  Administered 2023-10-06: 50 mg via ORAL
  Filled 2023-10-06 (×4): qty 1

## 2023-10-06 MED ORDER — PANTOPRAZOLE SODIUM 40 MG PO TBEC
80.0000 mg | DELAYED_RELEASE_TABLET | Freq: Every day | ORAL | Status: DC
  Administered 2023-10-07 – 2023-10-12 (×6): 80 mg via ORAL
  Filled 2023-10-06 (×10): qty 2

## 2023-10-06 MED ORDER — ACETAMINOPHEN 325 MG PO TABS
650.0000 mg | ORAL_TABLET | Freq: Four times a day (QID) | ORAL | Status: DC | PRN
  Administered 2023-10-07 – 2023-10-08 (×3): 650 mg via ORAL
  Filled 2023-10-06 (×3): qty 2

## 2023-10-06 MED ORDER — MAGNESIUM HYDROXIDE 400 MG/5ML PO SUSP
30.0000 mL | Freq: Every day | ORAL | Status: DC | PRN
  Administered 2023-10-10: 30 mL via ORAL
  Filled 2023-10-06: qty 30

## 2023-10-06 MED ORDER — TRAZODONE HCL 50 MG PO TABS
150.0000 mg | ORAL_TABLET | Freq: Every day | ORAL | Status: DC
  Administered 2023-10-06: 150 mg via ORAL
  Filled 2023-10-06: qty 1

## 2023-10-06 MED ORDER — NAPROXEN 500 MG PO TABS
500.0000 mg | ORAL_TABLET | Freq: Two times a day (BID) | ORAL | Status: AC | PRN
  Administered 2023-10-07: 500 mg via ORAL
  Filled 2023-10-06: qty 1

## 2023-10-06 MED ORDER — ALUM & MAG HYDROXIDE-SIMETH 200-200-20 MG/5ML PO SUSP: 30.0000 mL | ORAL | Status: DC | PRN

## 2023-10-06 MED ORDER — HALOPERIDOL 5 MG PO TABS: 5.0000 mg | ORAL_TABLET | Freq: Three times a day (TID) | ORAL | Status: DC | PRN

## 2023-10-06 MED ORDER — ACETAMINOPHEN 325 MG PO TABS
650.0000 mg | ORAL_TABLET | Freq: Four times a day (QID) | ORAL | Status: DC | PRN
  Administered 2023-10-06 (×3): 650 mg via ORAL
  Filled 2023-10-06 (×3): qty 2

## 2023-10-06 MED ORDER — VENLAFAXINE HCL ER 150 MG PO CP24
150.0000 mg | ORAL_CAPSULE | Freq: Every day | ORAL | Status: DC
  Administered 2023-10-07 – 2023-10-12 (×6): 150 mg via ORAL
  Filled 2023-10-06 (×9): qty 1

## 2023-10-06 MED ORDER — ATENOLOL 25 MG PO TABS
25.0000 mg | ORAL_TABLET | Freq: Every morning | ORAL | Status: DC
  Filled 2023-10-06 (×3): qty 2

## 2023-10-06 MED ORDER — CLONAZEPAM 0.5 MG PO TABS
0.5000 mg | ORAL_TABLET | Freq: Two times a day (BID) | ORAL | Status: DC | PRN
  Administered 2023-10-06 (×2): 0.5 mg via ORAL
  Filled 2023-10-06 (×2): qty 1

## 2023-10-06 MED ORDER — DIPHENHYDRAMINE HCL 25 MG PO CAPS: 50.0000 mg | ORAL_CAPSULE | Freq: Three times a day (TID) | ORAL | Status: DC | PRN

## 2023-10-06 MED ORDER — GABAPENTIN 300 MG PO CAPS
600.0000 mg | ORAL_CAPSULE | Freq: Three times a day (TID) | ORAL | Status: DC
  Administered 2023-10-07 (×2): 600 mg via ORAL
  Filled 2023-10-06 (×8): qty 2

## 2023-10-06 MED ORDER — DIPHENHYDRAMINE HCL 50 MG/ML IJ SOLN: 50.0000 mg | Freq: Three times a day (TID) | INTRAMUSCULAR | Status: DC | PRN

## 2023-10-06 MED ORDER — CLONAZEPAM 0.5 MG PO TABS
0.5000 mg | ORAL_TABLET | Freq: Two times a day (BID) | ORAL | Status: DC | PRN
  Administered 2023-10-06 – 2023-10-12 (×11): 0.5 mg via ORAL
  Filled 2023-10-06 (×11): qty 1

## 2023-10-06 MED ORDER — ATENOLOL 25 MG PO TABS
25.0000 mg | ORAL_TABLET | Freq: Every morning | ORAL | Status: DC
  Administered 2023-10-06: 50 mg via ORAL
  Filled 2023-10-06 (×2): qty 1

## 2023-10-06 MED ORDER — HALOPERIDOL LACTATE 5 MG/ML IJ SOLN: 10.0000 mg | Freq: Three times a day (TID) | INTRAMUSCULAR | Status: DC | PRN

## 2023-10-06 MED ORDER — VENLAFAXINE HCL ER 150 MG PO CP24
150.0000 mg | ORAL_CAPSULE | Freq: Every day | ORAL | Status: DC
  Filled 2023-10-06 (×2): qty 1

## 2023-10-06 MED ORDER — GABAPENTIN 300 MG PO CAPS
600.0000 mg | ORAL_CAPSULE | Freq: Three times a day (TID) | ORAL | Status: DC
  Administered 2023-10-06 (×4): 600 mg via ORAL
  Filled 2023-10-06 (×4): qty 2

## 2023-10-06 MED ORDER — LORAZEPAM 2 MG/ML IJ SOLN: 2.0000 mg | Freq: Three times a day (TID) | INTRAMUSCULAR | Status: DC | PRN

## 2023-10-06 MED ORDER — PANTOPRAZOLE SODIUM 40 MG PO TBEC
80.0000 mg | DELAYED_RELEASE_TABLET | Freq: Every day | ORAL | Status: DC
  Administered 2023-10-06: 80 mg via ORAL
  Filled 2023-10-06: qty 2

## 2023-10-06 MED ORDER — NAPROXEN 500 MG PO TABS
500.0000 mg | ORAL_TABLET | Freq: Two times a day (BID) | ORAL | Status: DC | PRN
  Administered 2023-10-06 (×2): 500 mg via ORAL
  Filled 2023-10-06 (×2): qty 1

## 2023-10-06 MED ORDER — HALOPERIDOL LACTATE 5 MG/ML IJ SOLN: 5.0000 mg | Freq: Three times a day (TID) | INTRAMUSCULAR | Status: DC | PRN

## 2023-10-06 NOTE — ED Provider Notes (Addendum)
 Patient is resting comfortably at this time.  Denies wanting to harm herself or others.  Denies feeling suicidal at this time.  She has been evaluated by psychiatry, they have advised that patient appropriate for inpatient psychiatry admission on a voluntary basis.  Patient stable for transfer via safe transport denies any desire to harm herself or anyone else at this time.     Sharyn Creamer, MD 10/06/23 1610    Sharyn Creamer, MD 10/06/23 2048

## 2023-10-06 NOTE — ED Notes (Signed)
Patient is vol pending placement 

## 2023-10-06 NOTE — ED Notes (Signed)
 Pt provided with lunch tray. Pt sitting up eating.
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
## 2023-10-06 NOTE — ED Notes (Signed)
 Safe transport called , Rosanne Ashing is here to get the pt.

## 2023-10-06 NOTE — ED Notes (Signed)
 Transfer of care report called to Emiliano Dyer at [phone].  Pt Hx, Dx, current condition, labs, vitals, discussed.  All questions asked were answered.    At [employer] arrived and was provided EMTALA paperwork, and medical necessity.  Pt belongings placed in front seat of car, pt seated in back.

## 2023-10-06 NOTE — ED Provider Notes (Signed)
 Emergency Medicine Observation Re-evaluation Note  Monike E Spilker is a 48 y.o. female, seen on rounds today.  Pt initially presented to the ED for complaints of Laceration and Psychiatric Evaluation  Currently, the patient is calm, no acute complaints.  Physical Exam  Blood pressure (!) 99/52, pulse 69, temperature 98 F (36.7 C), temperature source Oral, resp. rate 18, height 5\' 2"  (1.575 m), weight 68 kg, SpO2 97%. Physical Exam General: NAD Lungs: CTAB Psych: not agitated  ED Course / MDM  EKG:    I have reviewed the labs performed to date as well as medications administered while in observation.  Recent changes in the last 24 hours include no acute events overnight.    Plan  Current plan is for psych reassessment. Patient is not under full IVC at this time.   Sharman Cheek, MD 10/06/23 [phone]

## 2023-10-06 NOTE — ED Notes (Signed)
 Pt eating her lunch. Provided pt with with some napkins no other needs
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
## 2023-10-06 NOTE — ED Notes (Signed)
 Vol / Recommend observation overnight / re-evaluate this am

## 2023-10-06 NOTE — ED Notes (Signed)
 Pt. Refused shower at this time.

## 2023-10-06 NOTE — Consult Note (Signed)
 This patient was evaluated by psychiatry earlier this morning and recommended for am psych reassessment. However, given her stated suicide attempt and severity of her injury which required sutures, she will require inpatient admission. Above was discussed with [facility] medical team and [facility] via secure chat.

## 2023-10-06 NOTE — ED Notes (Signed)
 Hospital meal provided, pt tolerated w/o complaints.  Waste discarded appropriately.

## 2023-10-06 NOTE — ED Notes (Signed)
 Transfer of Care report called to Emiliano Dyer at

## 2023-10-06 NOTE — ED Notes (Signed)
 Provided pt with dinner tray and a cup of ginger ale

## 2023-10-07 ENCOUNTER — Other Ambulatory Visit: Payer: Self-pay

## 2023-10-07 ENCOUNTER — Encounter (HOSPITAL_COMMUNITY): Payer: Self-pay | Admitting: Adult Health

## 2023-10-07 DIAGNOSIS — F332 Major depressive disorder, recurrent severe without psychotic features: Secondary | ICD-10-CM | POA: Diagnosis not present

## 2023-10-07 MED ORDER — BUPROPION HCL ER (XL) 300 MG PO TB24
300.0000 mg | ORAL_TABLET | Freq: Every day | ORAL | Status: DC
  Administered 2023-10-07 – 2023-10-08 (×2): 300 mg via ORAL
  Filled 2023-10-07 (×5): qty 1

## 2023-10-07 MED ORDER — GABAPENTIN 300 MG PO CAPS
900.0000 mg | ORAL_CAPSULE | Freq: Two times a day (BID) | ORAL | Status: DC
  Administered 2023-10-07 – 2023-10-12 (×10): 900 mg via ORAL
  Filled 2023-10-07 (×14): qty 3

## 2023-10-07 MED ORDER — ATENOLOL 50 MG PO TABS
50.0000 mg | ORAL_TABLET | Freq: Every morning | ORAL | Status: DC
  Filled 2023-10-07 (×2): qty 1

## 2023-10-07 MED ORDER — NAPROXEN 500 MG PO TABS
500.0000 mg | ORAL_TABLET | Freq: Three times a day (TID) | ORAL | Status: DC | PRN
  Administered 2023-10-07 – 2023-10-12 (×9): 500 mg via ORAL
  Filled 2023-10-07 (×9): qty 1

## 2023-10-07 NOTE — Group Note (Signed)
 Date:  10/07/2023 Time:  4:24 PM  Group Topic/Focus:  Goals Group:   The focus of this group is to help patients establish daily goals to achieve during treatment and discuss how the patient can incorporate goal setting into their daily lives to aide in recovery. Orientation:   The focus of this group is to educate the patient on the purpose and policies of crisis stabilization and provide a format to answer questions about their admission.  The group details unit policies and expectations of patients while admitted.    Participation Level:  Did Not Attend  Participation Quality:   n/a  Affect:   n/a  Cognitive:   n/a  Insight: None  Engagement in Group:   n/a  Modes of Intervention:   n/a  Additional Comments:   Did not attend  Stark Bray 10/07/2023, 4:24 PM

## 2023-10-07 NOTE — Progress Notes (Addendum)
 D. Pt has been pleasant during interactions- voiced no complaints other than intermittent pain from injured (prior to admission) ankle. Pt encouraged to utilize walker, and to try to stay off of her feet- given prn naproxyn and Tylenol for pain. Pt has been visible in the milieu, observed attending groups. Pt currently denies SI/HI and AVH  A. Labs and vitals monitored. Pt given and educated on medications. Pt supported emotionally and encouraged to express concerns and ask questions.   R. Pt remains safe with 15 minute checks. Will continue POC.    10/07/23 0900  Psych Admission Type (Psych Patients Only)  Admission Status Voluntary  Psychosocial Assessment  Patient Complaints Anxiety;Depression  Eye Contact Fair  Facial Expression Anxious  Affect Appropriate to circumstance  Speech Logical/coherent  Interaction Assertive  Motor Activity Slow  Appearance/Hygiene Unremarkable  Behavior Characteristics Appropriate to situation;Cooperative  Mood Pleasant;Anxious  Thought Process  Coherency WDL  Content WDL  Delusions None reported or observed  Perception WDL  Hallucination None reported or observed  Judgment Impaired  Confusion None  Danger to Self  Current suicidal ideation? Denies  Danger to Others  Danger to Others None reported or observed
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
## 2023-10-07 NOTE — [facility] Suicide Risk Assessment (Signed)
 [facility] INPATIENT:  Family/Significant Other Suicide Prevention Education  Suicide Prevention Education:  Patient Refusal for Family/Significant Other Suicide Prevention Education: The patient Anne [name] has refused to provide written consent for family/significant other to be provided Family/Significant Other Suicide Prevention Education during admission and/or prior to discharge.  Physician notified.  Patient states everyone in her family talks bad about her and so there is no reason for [place] to talk to them.  Carloyn Jaeger Grossman-Orr 10/07/2023, 11:12 AM
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
## 2023-10-07 NOTE — Tx Team (Signed)
 Initial Treatment Plan 10/07/2023 1:26 AM Anne [name] IRJ:[id number]    PATIENT STRESSORS: Health problems   Marital or family conflict   Traumatic event     PATIENT STRENGTHS: Active sense of humor  Average or above average intelligence  Communication skills  Motivation for treatment/growth    PATIENT IDENTIFIED PROBLEMS: Depression  Suicidal Ideation      "Be able to handle stress better, learn coping mechanisms"             DISCHARGE CRITERIA:  Improved stabilization in mood, thinking, and/or behavior Motivation to continue treatment in a less acute level of care Need for constant or close observation no longer present Verbal commitment to aftercare and medication compliance  PRELIMINARY DISCHARGE PLAN: Outpatient therapy Return to previous living arrangement  PATIENT/FAMILY INVOLVEMENT: This treatment plan has been presented to and reviewed with the patient, Anne [name].  The patient and family have been given the opportunity to ask questions and make suggestions.  Juliann Pares, RN 10/07/2023, 1:26 AM

## 2023-10-07 NOTE — Group Note (Signed)
 Date:  10/07/2023 Time:  3:53 PM  Group Topic/Focus:  Rediscovering Joy:   The focus of this group is to explore various ways to relieve stress in a positive manner.    Participation Level:  Active  Participation Quality:  Appropriate and Attentive  Affect:  Appropriate  Cognitive:  Alert and Appropriate  Insight: Appropriate and Good  Engagement in Group:  Engaged and Supportive  Modes of Intervention:  Activity, Exploration, Rapport Building, Socialization, and Support  Additional Comments:    Shela Nevin 10/07/2023, 3:53 PM

## 2023-10-07 NOTE — Plan of Care (Signed)
   Problem: Education: Goal: Emotional status will improve Outcome: Progressing Goal: Mental status will improve Outcome: Progressing

## 2023-10-08 ENCOUNTER — Encounter (HOSPITAL_COMMUNITY): Payer: Self-pay

## 2023-10-08 DIAGNOSIS — F1220 Cannabis dependence, uncomplicated: Secondary | ICD-10-CM | POA: Diagnosis present

## 2023-10-08 DIAGNOSIS — Z8679 Personal history of other diseases of the circulatory system: Secondary | ICD-10-CM

## 2023-10-08 DIAGNOSIS — F332 Major depressive disorder, recurrent severe without psychotic features: Secondary | ICD-10-CM | POA: Diagnosis not present

## 2023-10-08 MED ORDER — QUETIAPINE FUMARATE 50 MG PO TABS
50.0000 mg | ORAL_TABLET | Freq: Every day | ORAL | Status: DC
  Administered 2023-10-08 – 2023-10-11 (×4): 50 mg via ORAL
  Filled 2023-10-08 (×6): qty 1

## 2023-10-08 MED ORDER — ATENOLOL 25 MG PO TABS
25.0000 mg | ORAL_TABLET | Freq: Every morning | ORAL | Status: DC
Start: 2023-10-08 — End: 2023-10-12
  Filled 2023-10-08 (×7): qty 1

## 2023-10-08 MED ORDER — SODIUM CHLORIDE 0.9 % IN NEBU
INHALATION_SOLUTION | RESPIRATORY_TRACT | Status: AC
  Filled 2023-10-08: qty 6

## 2023-10-08 NOTE — Progress Notes (Signed)
   10/08/23 2300  Psych Admission Type (Psych Patients Only)  Admission Status Voluntary  Psychosocial Assessment  Patient Complaints Anxiety  Eye Contact Fair  Facial Expression Anxious  Affect Appropriate to circumstance  Speech Logical/coherent  Interaction Assertive  Motor Activity  (WNL)  Appearance/Hygiene Unremarkable  Behavior Characteristics Appropriate to situation;Calm;Cooperative  Mood Pleasant;Euthymic  Thought Process  Coherency WDL  Content WDL  Delusions None reported or observed  Perception WDL  Hallucination None reported or observed  Judgment Impaired  Confusion None  Danger to Self  Current suicidal ideation? Denies  Danger to Others  Danger to Others None reported or observed

## 2023-10-08 NOTE — Group Note (Signed)
 Date:  10/08/2023 Time:  4:41 PM  Group Topic/Focus:  Emotional Education:   The focus of this group is to discuss what feelings/emotions are, and how they are experienced. Managing Feelings:   The focus of this group is to identify what feelings patients have difficulty handling and develop a plan to handle them in a healthier way upon discharge.    Participation Level:  Did Not Attend  Participation Quality:   n/a  Affect:   n/a  Cognitive:   n/a  Insight: None  Engagement in Group:   n/a  Modes of Intervention:   n/a  Additional Comments:   Did not attend  Stark Bray 10/08/2023, 4:41 PM
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
## 2023-10-08 NOTE — [facility] Group Notes (Signed)
 [facility] Group Notes:  (Nursing/MHT/Case Management/Adjunct)  Date:  10/08/2023  Time:  2000  Type of Therapy:   Wrap up group  Participation Level:  Active  Participation Quality:  Appropriate and Attentive  Affect:  Appropriate  Cognitive:  Alert and Appropriate  Insight:  Appropriate and Good  Engagement in Group:  Engaged  Modes of Intervention:  Discussion  Summary of Progress/Problems:  Anne [name] 10/08/2023, 8:53 PM

## 2023-10-08 NOTE — Plan of Care (Signed)
   Problem: Education: Goal: Emotional status will improve Outcome: Progressing Goal: Mental status will improve Outcome: Progressing   Problem: Activity: Goal: Interest or engagement in activities will improve Outcome: Progressing

## 2023-10-08 NOTE — BH IP Treatment Plan (Signed)
 Interdisciplinary Treatment and Diagnostic Plan Update  10/08/2023 Time of Session: 1055 Anne [name] MRN: [id number]  Principal Diagnosis: MDD (major depressive disorder), recurrent episode, severe (HCC)  Secondary Diagnoses: Principal Problem:   MDD (major depressive disorder), recurrent episode, severe (HCC) Active Problems:   GAD (generalized anxiety disorder)   Current Medications:  Current Facility-Administered Medications  Medication Dose Route Frequency Provider Last Rate Last Admin   acetaminophen (TYLENOL) tablet 650 mg  650 mg Oral Q6H PRN Chales Abrahams, NP   650 mg at 10/07/23 2011   alum & mag hydroxide-simeth (MAALOX/MYLANTA) 200-200-20 MG/5ML suspension 30 mL  30 mL Oral Q4H PRN Chales Abrahams, NP       atenolol (TENORMIN) tablet 25 mg  25 mg Oral q morning Abbott Pao, Nadir, MD       buPROPion (WELLBUTRIN XL) 24 hr tablet 300 mg  300 mg Oral Daily Armandina Stammer I, NP   300 mg at 10/08/23 0810   clonazePAM (KLONOPIN) tablet 0.5 mg  0.5 mg Oral BID PRN Chales Abrahams, NP   0.5 mg at 10/08/23 4098   haloperidol (HALDOL) tablet 5 mg  5 mg Oral TID PRN Chales Abrahams, NP       And   diphenhydrAMINE (BENADRYL) capsule 50 mg  50 mg Oral TID PRN Chales Abrahams, NP       haloperidol lactate (HALDOL) injection 5 mg  5 mg Intramuscular TID PRN Chales Abrahams, NP       And   diphenhydrAMINE (BENADRYL) injection 50 mg  50 mg Intramuscular TID PRN Chales Abrahams, NP       And   LORazepam (ATIVAN) injection 2 mg  2 mg Intramuscular TID PRN Ophelia Shoulder E, NP       haloperidol lactate (HALDOL) injection 10 mg  10 mg Intramuscular TID PRN Chales Abrahams, NP       And   diphenhydrAMINE (BENADRYL) injection 50 mg  50 mg Intramuscular TID PRN Chales Abrahams, NP       And   LORazepam (ATIVAN) injection 2 mg  2 mg Intramuscular TID PRN Chales Abrahams, NP       gabapentin (NEURONTIN) capsule 900 mg  900 mg Oral BID Armandina Stammer I, NP   900 mg at 10/08/23 0810   magnesium  hydroxide (MILK OF MAGNESIA) suspension 30 mL  30 mL Oral Daily PRN Chales Abrahams, NP       naproxen (NAPROSYN) tablet 500 mg  500 mg Oral TID PRN Armandina Stammer I, NP   500 mg at 10/08/23 1059   pantoprazole (PROTONIX) EC tablet 80 mg  80 mg Oral Daily Ophelia Shoulder E, NP   80 mg at 10/08/23 0810   traZODone (DESYREL) tablet 150 mg  150 mg Oral QHS Ophelia Shoulder E, NP   150 mg at 10/07/23 2136   venlafaxine XR (EFFEXOR-XR) 24 hr capsule 150 mg  150 mg Oral Q breakfast Ophelia Shoulder E, NP   150 mg at 10/08/23 1191   PTA Medications: Medications Prior to Admission  Medication Sig Dispense Refill Last Dose/Taking   acetaminophen (TYLENOL) 500 MG tablet Take 1,000 mg by mouth every 6 (six) hours as needed for moderate pain or headache.      atenolol (TENORMIN) 25 MG tablet Take 25-50 mg by mouth every morning.      buPROPion (WELLBUTRIN XL) 300 MG 24 hr tablet Take 300 mg by mouth daily.      clonazePAM (  KLONOPIN) 0.5 MG tablet Take 1-2 tablets by mouth daily as needed (SEVERE ANXIETY/PANIC).      EFFEXOR XR 150 MG 24 hr capsule Take 150 mg by mouth daily.      gabapentin (NEURONTIN) 300 MG capsule Take 900 mg by mouth 3 (three) times daily.      Multiple Vitamin (MULTIVITAMIN WITH MINERALS) TABS tablet Take 1 tablet by mouth daily.      omeprazole (PRILOSEC) 20 MG capsule Take 40 mg by mouth 2 (two) times daily before a meal.      QUEtiapine (SEROQUEL) 50 MG tablet Take 50 mg by mouth at bedtime.      traZODone (DESYREL) 150 MG tablet 100 mg at bedtime as needed.       Patient Stressors: Health problems   Marital or family conflict   Traumatic event    Patient Strengths: Active sense of humor  Average or above average intelligence  Communication skills  Motivation for treatment/growth   Treatment Modalities: Medication Management, Group therapy, Case management,  1 to 1 session with clinician, Psychoeducation, Recreational therapy.   Physician Treatment Plan for Primary Diagnosis: MDD  (major depressive disorder), recurrent episode, severe (HCC) Long Term Goal(s): Improvement in symptoms so as ready for discharge   Short Term Goals: Ability to identify and develop effective coping behaviors will improve Ability to maintain clinical measurements within normal limits will improve Compliance with prescribed medications will improve Ability to identify triggers associated with substance abuse/mental health issues will improve Ability to identify changes in lifestyle to reduce recurrence of condition will improve Ability to verbalize feelings will improve Ability to disclose and discuss suicidal ideas Ability to demonstrate self-control will improve  Medication Management: Evaluate patient's response, side effects, and tolerance of medication regimen.  Therapeutic Interventions: 1 to 1 sessions, Unit Group sessions and Medication administration.  Evaluation of Outcomes: Not Progressing  Physician Treatment Plan for Secondary Diagnosis: Principal Problem:   MDD (major depressive disorder), recurrent episode, severe (HCC) Active Problems:   GAD (generalized anxiety disorder)  Long Term Goal(s): Improvement in symptoms so as ready for discharge   Short Term Goals: Ability to identify and develop effective coping behaviors will improve Ability to maintain clinical measurements within normal limits will improve Compliance with prescribed medications will improve Ability to identify triggers associated with substance abuse/mental health issues will improve Ability to identify changes in lifestyle to reduce recurrence of condition will improve Ability to verbalize feelings will improve Ability to disclose and discuss suicidal ideas Ability to demonstrate self-control will improve     Medication Management: Evaluate patient's response, side effects, and tolerance of medication regimen.  Therapeutic Interventions: 1 to 1 sessions, Unit Group sessions and Medication  administration.  Evaluation of Outcomes: Not Progressing   RN Treatment Plan for Primary Diagnosis: MDD (major depressive disorder), recurrent episode, severe (HCC) Long Term Goal(s): Knowledge of disease and therapeutic regimen to maintain health will improve  Short Term Goals: Ability to remain free from injury will improve, Ability to verbalize frustration and anger appropriately will improve, Ability to demonstrate self-control, Ability to participate in decision making will improve, Ability to verbalize feelings will improve, Ability to disclose and discuss suicidal ideas, Ability to identify and develop effective coping behaviors will improve, and Compliance with prescribed medications will improve  Medication Management: RN will administer medications as ordered by provider, will assess and evaluate patient's response and provide education to patient for prescribed medication. RN will report any adverse and/or side effects to prescribing provider.  Therapeutic Interventions: 1 on 1 counseling sessions, Psychoeducation, Medication administration, Evaluate responses to treatment, Monitor vital signs and CBGs as ordered, Perform/monitor CIWA, COWS, AIMS and Fall Risk screenings as ordered, Perform wound care treatments as ordered.  Evaluation of Outcomes: Not Progressing   LCSW Treatment Plan for Primary Diagnosis: MDD (major depressive disorder), recurrent episode, severe (HCC) Long Term Goal(s): Safe transition to appropriate next level of care at discharge, Engage patient in therapeutic group addressing interpersonal concerns.  Short Term Goals: Engage patient in aftercare planning with referrals and resources, Increase social support, Increase ability to appropriately verbalize feelings, Increase emotional regulation, Facilitate acceptance of mental health diagnosis and concerns, Facilitate patient progression through stages of change regarding substance use diagnoses and concerns,  Identify triggers associated with mental health/substance abuse issues, and Increase skills for wellness and recovery  Therapeutic Interventions: Assess for all discharge needs, 1 to 1 time with Social worker, Explore available resources and support systems, Assess for adequacy in community support network, Educate family and significant other(s) on suicide prevention, Complete Psychosocial Assessment, Interpersonal group therapy.  Evaluation of Outcomes: Not Progressing   Progress in Treatment: Attending groups: Yes. Participating in groups: Yes. Taking medication as prescribed: Yes. Toleration medication: Yes. Family/Significant other contact made: No, will contact:  pt declined Patient understands diagnosis: Yes. Discussing patient identified problems/goals with staff: Yes. Medical problems stabilized or resolved: Yes. Denies suicidal/homicidal ideation: Yes. Issues/concerns per patient self-inventory: No.  New problem(s) identified: No, Describe:  none  New Short Term/Long Term Goal(s): medication stabilization, elimination of SI thoughts, development of comprehensive mental wellness plan.    Patient Goals:  "Learn new coping skills and get a therapist"  Discharge Plan or Barriers: Patient recently admitted. CSW will continue to follow and assess for appropriate referrals and possible discharge planning.    Reason for Continuation of Hospitalization: Anxiety Depression Medication stabilization Suicidal ideation  Estimated Length of Stay: 5-7 days  Last 3 [place] Suicide Severity Risk Score: Flowsheet Row [facility] (Current[facility] from [facility]05/2023 in [facility]  C-SSRS RISK CATEGORY No Risk No Risk       Last PHQ 2/9 Scores:     No data to display          Scribe for Treatment Team: Kathi Der, LCSWA 10/08/2023 2:16 PM

## 2023-10-08 NOTE — Group Note (Signed)
 Date:  10/08/2023 Time:  4:56 PM  Group Topic/Focus:  Self Care:   The focus of this group is to help patients understand the importance of self-care in order to improve or restore emotional, physical, spiritual, interpersonal, and financial health.    Participation Level:  Active  Participation Quality:  Appropriate  Affect:  Appropriate  Cognitive:  Appropriate  Insight: Appropriate  Engagement in Group:  Engaged  Modes of Intervention:  Education  Additional Comments:   Pt attended the Physical Wellness group.   Edmund [name] Samar Venneman 10/08/2023, 4:56 PM

## 2023-10-08 NOTE — Progress Notes (Signed)
 Pt's wound cleansed with sterile normal saline- redressed with non adherent sterile pad- No redness, swelling, drainage observed
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
## 2023-10-08 NOTE — Group Note (Signed)
 Recreation Therapy Group Note   Group Topic:Leisure Education  Group Date: 10/08/2023 Start Time: 0930 End Time: 1000 Facilitators: Josedejesus Marcum-McCall, LRT,CTRS Location: [address] Dayroom   Group Topic: Leisure Education  Goal Area(s) Addresses:  Patient will identify positive leisure activities for use post discharge. Patient will identify at least one positive benefit of participation in leisure activities.   Intervention: Innovation, Group Presentation   Activity: LRT and patients set room in a way that would be effective in completing activity. Patients were seated in a circle and given a beach ball. Patients were instructed they were to hit the ball back and forth to each other as if playing volleyball. LRT would time the patients as they played the game. The ball could bounce or roll but not come to a complete stop. It the ball came to a stop at any time, the timer would reset and start over. Patients were to remain seated throughout activity but could get up if the ball escaped the circle at any point.  Education:  Leisure Scientist, physiological, Special educational needs teacher, Teamwork, Discharge Planning  Education Outcome: Acknowledges education/In group clarification offered/Needs additional education.    Affect/Mood: N/A   Participation Level: Did not attend    Clinical Observations/Individualized Feedback:      Plan: Continue to engage patient in RT group sessions 2-3x/week.   Maizee Reinhold-McCall, LRT,CTRS  10/08/2023 12:43 PM

## 2023-10-08 NOTE — Progress Notes (Addendum)
 D. Pt presented very tired, but friendly upon initial approach- reported poor sleep last night, despite receiving sleep medication. Pt described her appetite and concentration as 'good', and energy level as 'normal' today. Per pt's self inventory, pt rated her depression,hopelessness and anxiety a 0/0/5, respectively. Pt's stated goal today is "to continue to learn new coping skills". Pt  denies SI/HI and AVH and does not appear to be responding to internal stimuli.   A. Labs and vitals monitored. Pt given and educated on medications. Pt supported emotionally and encouraged to express concerns and ask questions.   R. Pt remains safe with 15 minute checks. Will continue POC.    10/08/23 0900  Psych Admission Type (Psych Patients Only)  Admission Status Voluntary  Psychosocial Assessment  Patient Complaints Anxiety;Sleep disturbance  Eye Contact Fair  Facial Expression Anxious  Affect Appropriate to circumstance  Speech Logical/coherent  Interaction Assertive  Motor Activity Slow  Appearance/Hygiene Unremarkable  Behavior Characteristics Appropriate to situation;Cooperative  Mood Anxious;Pleasant  Thought Process  Coherency WDL  Content WDL  Delusions None reported or observed  Perception WDL  Hallucination None reported or observed  Judgment Impaired  Confusion None  Danger to Self  Current suicidal ideation? Denies  Danger to Others  Danger to Others None reported or observed

## 2023-10-08 NOTE — Group Note (Signed)
 Date:  10/08/2023 Time:  4:10 PM  Group Topic/Focus:  Goals Group:   The focus of this group is to help patients establish daily goals to achieve during treatment and discuss how the patient can incorporate goal setting into their daily lives to aide in recovery. Orientation:   The focus of this group is to educate the patient on the purpose and policies of crisis stabilization and provide a format to answer questions about their admission.  The group details unit policies and expectations of patients while admitted.    Participation Level:  Did Not Attend  Participation Quality:   n/a  Affect:   n/a  Cognitive:   n/a  Insight: None  Engagement in Group:   n/a  Modes of Intervention:   n/a  Additional Comments:   Did not attend  Forrester Blando M Chelsea Nusz 10/08/2023, 4:10 PM

## 2023-10-08 NOTE — Plan of Care (Signed)
  Problem: Education: Goal: Emotional status will improve Outcome: Progressing   Problem: Activity: Goal: Interest or engagement in activities will improve Outcome: Progressing   Problem: Coping: Goal: Ability to demonstrate self-control will improve Outcome: Progressing   Problem: Education: Goal: Emotional status will improve Outcome: Progressing   Problem: Activity: Goal: Interest or engagement in activities will improve Outcome: Progressing   Problem: Coping: Goal: Ability to demonstrate self-control will improve Outcome: Progressing

## 2023-10-09 DIAGNOSIS — F332 Major depressive disorder, recurrent severe without psychotic features: Secondary | ICD-10-CM

## 2023-10-09 LAB — LIPID PANEL
Cholesterol: 286 mg/dL — ABNORMAL HIGH (ref 0–200)
HDL: 65 mg/dL (ref >40)
LDL Cholesterol: 162 mg/dL — ABNORMAL HIGH (ref 0–99)
Total CHOL/HDL Ratio: 4.4 ratio
Triglycerides: 293 mg/dL — ABNORMAL HIGH (ref <150)
VLDL: 59 mg/dL — ABNORMAL HIGH (ref 0–40)

## 2023-10-09 LAB — VITAMIN D 25 HYDROXY (VIT D DEFICIENCY, FRACTURES): Vit D, 25-Hydroxy: 32.66 ng/mL (ref 30–100)

## 2023-10-09 LAB — FERRITIN: Ferritin: 37 ng/mL (ref 11–307)

## 2023-10-09 LAB — TSH: TSH: 5.792 u[IU]/mL — ABNORMAL HIGH (ref 0.350–4.500)

## 2023-10-09 LAB — VITAMIN B12: Vitamin B-12: 243 pg/mL (ref 180–914)

## 2023-10-09 LAB — HEMOGLOBIN A1C
Hgb A1c MFr Bld: 4.5 % — ABNORMAL LOW (ref 4.8–5.6)
Mean Plasma Glucose: 82.45 mg/dL

## 2023-10-09 MED ORDER — SODIUM CHLORIDE 0.9 % IN NEBU
INHALATION_SOLUTION | RESPIRATORY_TRACT | Status: AC
  Filled 2023-10-09: qty 3

## 2023-10-09 MED ORDER — SODIUM CHLORIDE 0.9 % IN NEBU
INHALATION_SOLUTION | RESPIRATORY_TRACT | Status: AC
Start: 2023-10-09 — End: 2023-10-09
  Filled 2023-10-09: qty 3

## 2023-10-09 MED ORDER — NICOTINE 14 MG/24HR TD PT24
14.0000 mg | MEDICATED_PATCH | Freq: Every day | TRANSDERMAL | Status: DC
  Administered 2023-10-09 – 2023-10-11 (×3): 14 mg via TRANSDERMAL
  Filled 2023-10-09 (×8): qty 1

## 2023-10-09 NOTE — [facility] Group Notes (Signed)
 Spiritual care group on grief and loss facilitated by Chaplain Dyanne Carrel, [facility]  Group Goal: Support / Education around grief and loss  Members engage in facilitated group support and psycho-social education.  Group Description:  Following introductions and group rules, group members engaged in facilitated group dialogue and support around topic of loss, with particular support around experiences of loss in their lives. Group Identified types of loss (relationships / self / things) and identified patterns, circumstances, and changes that precipitate losses. Reflected on thoughts / feelings around loss, normalized grief responses, and recognized variety in grief experience. Group encouraged individual reflection on safe space and on the coping skills that they are already utilizing.  Group drew on Adlerian / Rogerian and narrative framework  Patient Progress: Anne [name] attended group and actively engaged and participated in group conversation and activities.

## 2023-10-09 NOTE — [facility] Group Notes (Signed)
 [facility] Group Notes:  (Nursing/MHT/Case Management/Adjunct)  Date:  10/09/2023  Time:  9:52 PM  Type of Therapy:   Wrap-up group  Participation Level:  Active  Participation Quality:  Appropriate  Affect:  Appropriate  Cognitive:  Appropriate  Insight:  Appropriate  Engagement in Group:  Engaged  Modes of Intervention:  Education  Summary of Progress/Problems: Goal to be positive. Rated day 10/10.  Anne [name] 10/09/2023, 9:52 PM

## 2023-10-09 NOTE — Progress Notes (Signed)
   10/09/23 0900  Psych Admission Type (Psych Patients Only)  Admission Status Voluntary  Psychosocial Assessment  Patient Complaints Anxiety  Eye Contact Fair  Facial Expression Anxious  Affect Appropriate to circumstance  Speech Logical/coherent  Interaction Assertive  Motor Activity Slow  Appearance/Hygiene Unremarkable  Behavior Characteristics Cooperative;Calm  Mood Pleasant;Anxious  Thought Process  Coherency WDL  Content WDL  Delusions None reported or observed  Perception WDL  Hallucination None reported or observed  Judgment Impaired  Confusion None  Danger to Self  Current suicidal ideation? Denies  Danger to Others  Danger to Others None reported or observed

## 2023-10-09 NOTE — Plan of Care (Signed)
  Problem: Education: Goal: Emotional status will improve Outcome: Progressing Goal: Verbalization of understanding the information provided will improve Outcome: Progressing   Problem: Activity: Goal: Sleeping patterns will improve Outcome: Progressing   Problem: Health Behavior/Discharge Planning: Goal: Identification of resources available to assist in meeting health care needs will improve Outcome: Progressing   Problem: Education: Goal: Utilization of techniques to improve thought processes will improve Outcome: Progressing   Problem: Self-Concept: Goal: Ability to identify factors that promote anxiety will improve Outcome: Progressing

## 2023-10-09 NOTE — Progress Notes (Signed)
   10/09/23 2300  Psych Admission Type (Psych Patients Only)  Admission Status Voluntary  Psychosocial Assessment  Patient Complaints Anxiety  Eye Contact Fair  Facial Expression Anxious  Affect Appropriate to circumstance  Speech Logical/coherent  Interaction Assertive  Motor Activity Slow (WNL)  Appearance/Hygiene Unremarkable  Behavior Characteristics Cooperative;Calm  Mood Euthymic;Pleasant  Thought Process  Coherency WDL  Content WDL  Delusions None reported or observed  Perception WDL  Hallucination None reported or observed  Judgment Impaired  Confusion None  Danger to Self  Current suicidal ideation? Denies  Danger to Others  Danger to Others None reported or observed

## 2023-10-09 NOTE — Group Note (Signed)
 Recreation Therapy Group Note   Group Topic:Animal Assisted Therapy   Group Date: 10/09/2023 Start Time: 0945 End Time: 1031 Facilitators: Anne [name], LRT,CTRS Location: [address] Day[address]   Animal-Assisted Activity (AAA) Program Checklist/Progress Notes Patient Eligibility Criteria Checklist & Daily Group note for Rec Tx Intervention  AAA/T Program Assumption of Risk Form signed by Patient/ or Parent Legal Guardian Yes  Patient is free of allergies or severe asthma Yes  Patient reports no fear of animals Yes  Patient reports no history of cruelty to animals Yes  Patient understands his/her participation is voluntary Yes  Patient washes hands before animal contact Yes  Patient washes hands after animal contact Yes  Education: Hand Washing, Appropriate Animal Interaction   Education Outcome: Acknowledges education.    Affect/Mood: Appropriate   Participation Level: Engaged   Participation Quality: Independent   Behavior: Appropriate   Speech/Thought Process: Focused   Insight: Good   Judgement: Good   Modes of Intervention: Teaching laboratory technician   Patient Response to Interventions:  Engaged   Education Outcome:  In group clarification offered    Clinical Observations/Individualized Feedback: Patient attended session and interacted appropriately with therapy dog and peers. Patient asked appropriate questions about therapy dog and his training. Patient shared stories about their pets at home with group.      Plan: Continue to engage patient in RT group sessions 2-3x/week.   Anne [name], LRT,CTRS 10/09/2023 1:29 PM

## 2023-10-09 NOTE — Plan of Care (Signed)
   Problem: Education: Goal: Knowledge of [facility] Education information/materials will improve Outcome: Progressing Goal: Emotional status will improve Outcome: Progressing Goal: Mental status will improve Outcome: Progressing Goal: Verbalization of understanding the information provided will improve Outcome: Progressing
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
## 2023-10-09 NOTE — Group Note (Signed)
 LCSW Group Therapy Note   Group Date: 10/09/2023 Start Time: 1100 End Time: 1200   Participation:  patient attended half of the group session  Type of Therapy:  Group Therapy  Title:  Speaking from the Heart: Communicating with Understanding and Empathy  Objective:  To help participants develop effective communication skills to express themselves clearly, listen actively, and navigate conflicts in a healthy way.  Goals: Increase awareness of verbal and non-verbal communication skills. Practice using "I" statements and active listening techniques. Learn coping strategies for managing communication stress.  Summary:  Participants explored the importance of communication, discussed challenges, and practiced skills such as active listening and assertive expression. They reflected on past experiences and identified ways to improve communication in their daily lives.  Therapeutic Modalities: Cognitive-Behavioral Therapy (CBT): Restructuring negative thought patterns in communication. Mindfulness: Staying present and calm during conversations.   Alla Feeling, LCSWA 10/09/2023  12:35 PM

## 2023-10-10 DIAGNOSIS — F332 Major depressive disorder, recurrent severe without psychotic features: Secondary | ICD-10-CM | POA: Diagnosis not present

## 2023-10-10 LAB — T4, FREE: Free T4: 0.75 ng/dL (ref 0.61–1.12)

## 2023-10-10 NOTE — Group Note (Signed)
 Recreation Therapy Group Note   Group Topic:Communication  Group Date: 10/10/2023 Start Time: 0935 End Time: 1012 Facilitators: Amore Ackman-McCall, LRT,CTRS Location: [address] Dayroom   Group Topic: Communication, Problem Solving   Goal Area(s) Addresses:  Patient will effectively listen to complete activity.  Patient will identify communication skills used to make activity successful.  Patient will identify how skills used during activity can be used to reach post d/c goals.    Intervention: Building surveyor Activity - Geometric pattern cards, pencils, blank paper    Activity: Geometric Drawings.  Three volunteers from the peer group will be shown an abstract picture with a particular arrangement of geometrical shapes.  Each round, one 'speaker' will describe the pattern, as accurately as possible without revealing the image to the group.  The remaining group members will listen and draw the picture to reflect how it is described to them. Patients with the role of 'listener' cannot ask clarifying questions but, may request that the speaker repeat a direction. Once the drawings are complete, the presenter will show the rest of the group the picture and compare how close each person came to drawing the picture. LRT will facilitate a post-activity discussion regarding effective communication and the importance of planning, listening, and asking for clarification in daily interactions with others.  Education: Environmental consultant, Active listening, Support systems, Discharge planning  Education Outcome: Acknowledges understanding/In group clarification offered/Needs additional education.    Affect/Mood: Appropriate   Participation Level: Engaged   Participation Quality: Independent   Behavior: Appropriate   Speech/Thought Process: Focused   Insight: Good   Judgement: Good   Modes of Intervention: Activity   Patient Response to Interventions:  Engaged   Education  Outcome:  In group clarification offered    Clinical Observations/Individualized Feedback: Pt was engaged and attentive during activity.    Plan: Continue to engage patient in RT group sessions 2-3x/week.   Anne [name], LRT,CTRS  10/10/2023 12:22 PM

## 2023-10-10 NOTE — Plan of Care (Signed)
  Problem: Education: Goal: Emotional status will improve Outcome: Progressing Goal: Verbalization of understanding the information provided will improve Outcome: Progressing   Problem: Coping: Goal: Ability to verbalize frustrations and anger appropriately will improve Outcome: Progressing   Problem: Health Behavior/Discharge Planning: Goal: Compliance with treatment plan for underlying cause of condition will improve Outcome: Progressing   Problem: Education: Goal: Utilization of techniques to improve thought processes will improve Outcome: Progressing

## 2023-10-10 NOTE — Group Note (Signed)
 Date:  10/10/2023 Time:  4:21 PM  Group Topic/Focus:  Goals Group:   The focus of this group is to help patients establish daily goals to achieve during treatment and discuss how the patient can incorporate goal setting into their daily lives to aide in recovery. Orientation:   The focus of this group is to educate the patient on the purpose and policies of crisis stabilization and provide a format to answer questions about their admission.  The group details unit policies and expectations of patients while admitted.    Participation Level:  Active  Participation Quality:  Appropriate  Affect:  Appropriate  Cognitive:  Appropriate  Insight: Appropriate  Engagement in Group:  Engaged  Modes of Intervention:  Discussion and Orientation  Additional Comments:   Pt attended the Orientation and Goals group.  Anne [name] [name] [name] [name] 10/10/2023, 4:21 PM

## 2023-10-10 NOTE — Progress Notes (Signed)
 Wound dressing change completed along with provider. Steri strips placed where wound edges are not approximated and stitches came apart. Patient tolerated well with no further concerns.

## 2023-10-10 NOTE — Progress Notes (Signed)
   10/10/23 0900  Psych Admission Type (Psych Patients Only)  Admission Status Voluntary  Psychosocial Assessment  Patient Complaints Anxiety  Eye Contact Fair  Facial Expression Anxious  Affect Appropriate to circumstance  Speech Logical/coherent  Interaction Assertive  Motor Activity Slow  Appearance/Hygiene Unremarkable  Behavior Characteristics Cooperative;Calm  Mood Pleasant;Anxious  Thought Process  Coherency WDL  Content WDL  Delusions None reported or observed  Perception WDL  Hallucination None reported or observed  Judgment Impaired  Confusion None  Danger to Self  Current suicidal ideation? Denies  Danger to Others  Danger to Others None reported or observed

## 2023-10-10 NOTE — Progress Notes (Signed)
   10/10/23 1515  Spiritual Encounters  Type of Visit Initial  Care provided to: Patient  Referral source Patient request  Spiritual Framework  Presenting Themes Meaning/purpose/sources of inspiration;Community and relationships   Per patient request, I met with Anne [name] to provide spiritual care support.  Anne [name] debriefed with me around her father's illness, being a caregiver, and other family dynamics that have been stressors. Also, recent loss of aunt this past October and a close unlce six years ago represent two losses that Anne [name] continues to grieve.  I provided active and reflective listening. I offered compassionate presence and affirmation of [name]'s faith, sobriety, and ways that she has been an outgoing person offering support to others while not always receiving support. Discussed concerns around safety in home.  Will aim to follow up and let [name] know that support remains available.  Kardell Virgil L. Sophronia Simas, M.Div [phone]

## 2023-10-10 NOTE — Progress Notes (Signed)
   10/10/23 2200  Psych Admission Type (Psych Patients Only)  Admission Status Voluntary  Psychosocial Assessment  Patient Complaints Anxiety  Eye Contact Fair  Facial Expression Anxious  Affect Appropriate to circumstance  Speech Logical/coherent  Interaction Assertive  Motor Activity Slow (WNL)  Appearance/Hygiene Unremarkable  Behavior Characteristics Cooperative;Calm  Mood Pleasant  Thought Process  Coherency WDL  Content WDL  Delusions None reported or observed  Perception WDL  Hallucination None reported or observed  Judgment Impaired  Confusion None  Danger to Self  Current suicidal ideation? Denies  Danger to Others  Danger to Others None reported or observed

## 2023-10-10 NOTE — [facility] Group Notes (Signed)
 Spirituality Group Focus of discussion: Gratitude and Strength Awareness  Process: Following theoretical framework of group therapy of Chyrl Civatte and further informed by Rogerian and Relational Cultural Theory approaches, participants invited to name:  Sources of gratitude (internal>external) Articulate gratitude for self Name a personal strength/gift/skill Locate points of resonance among group members/engage the "here and now" Conclude with grounding/breathwork  Observations: Anne [name] was an active participant in the group discussion.  Anne [name] L. Anne [name], M.Div [phone]

## 2023-10-10 NOTE — Group Note (Signed)
 Date:  10/10/2023 Time:  4:46 PM  Group Topic/Focus:  Dimensions of Wellness:   The focus of this group is to introduce the topic of wellness and discuss the role each dimension of wellness plays in total health.    Participation Level:  Active  Participation Quality:  Appropriate  Affect:  Appropriate  Cognitive:  Appropriate  Insight: Appropriate  Engagement in Group:  Engaged  Modes of Intervention:  Activity and Education  Additional Comments:   Pt attended the Wellness group and completed the Dimension of Wellness activity.  Anne [name] 10/10/2023, 4:46 PM

## 2023-10-10 NOTE — Plan of Care (Signed)
   Problem: Education: Goal: Knowledge of [facility] Education information/materials will improve Outcome: Progressing Goal: Emotional status will improve Outcome: Progressing Goal: Mental status will improve Outcome: Progressing Goal: Verbalization of understanding the information provided will improve Outcome: Progressing
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
## 2023-10-10 NOTE — [facility] Group Notes (Signed)
 [facility] Group Notes:  (Nursing/MHT/Case Management/Adjunct)  Date:  10/10/2023  Time:  2000  Type of Therapy:   NA  Participation Level:  Active  Participation Quality:  Appropriate  Affect:  Appropriate  Cognitive:  Alert  Insight:  Appropriate  Engagement in Group:  Engaged  Modes of Intervention:  Education  Summary of Progress/Problems: Attended NA Meeting  Rafael Salway M Majour Frei 10/10/2023, 2000

## 2023-10-11 DIAGNOSIS — F332 Major depressive disorder, recurrent severe without psychotic features: Secondary | ICD-10-CM | POA: Diagnosis not present

## 2023-10-11 LAB — T3, FREE: T3, Free: 2.5 pg/mL (ref 2.0–4.4)

## 2023-10-11 MED ORDER — DOCUSATE SODIUM 100 MG PO CAPS
100.0000 mg | ORAL_CAPSULE | Freq: Two times a day (BID) | ORAL | Status: DC
  Administered 2023-10-11 (×2): 100 mg via ORAL
  Filled 2023-10-11 (×8): qty 1

## 2023-10-11 MED ORDER — POLYETHYLENE GLYCOL 3350 17 G PO PACK
17.0000 g | PACK | Freq: Every day | ORAL | Status: DC
  Administered 2023-10-11: 17 g via ORAL
  Filled 2023-10-11 (×5): qty 1
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
## 2023-10-11 NOTE — Progress Notes (Signed)
   10/11/23 1246  Spiritual Encounters  Type of Visit Follow up  Care provided to: Patient  Referral source Chaplain assessment  Spiritual Framework  Presenting Themes Meaning/purpose/sources of inspiration;Courage hope and growth   I met very briefly with Anne [name] in follow up to our conversation on 3/26.  Anne [name] seemed to have a brighter affect and expressed feeling hopeful. She named new goals she is contemplating and appreciated the opportunity to engage and also have her faith affirmed.  I offered ongoing relational support and words of encouragement. Spiritual care support remains available as needed. Nyaire Denbleyker L. Sophronia Simas, M.Div [phone]

## 2023-10-11 NOTE — Group Note (Signed)
 LCSW Group Therapy Note   Group Date: 10/11/2023 Start Time: 1100 End Time: 1200   Participation:  patient was present  Type of Therapy:  Group Therapy  Topic:  Stronger Together: Building Healthy Relationships  Objective:  To explore loneliness, boundaries, and safe ways to build relationships.  Goals: Recognize healthy vs. unhealthy relationships. Learn safe ways to connect with others. Strengthen communication and [employer].  Summary:  Participants discussed loneliness, healthy connections, and setting boundaries. They explored safe ways to meet people and shared personal experiences. Key insights were reinforced through discussion and quotes.  Therapeutic Modalities Used: Elements of Cognitive Behavioral Therapy (CBT) - Identifying unhealthy relationship patterns, challenging negative thoughts about connection. Elements of Dialectical Behavior Therapy (DBT) - Interpersonal effectiveness, setting and maintaining boundaries. Supportive Group Therapy - Peer discussion, shared experiences, and emotional validation.   Alla Feeling, LCSWA 10/11/2023  1:52 PM

## 2023-10-11 NOTE — Progress Notes (Signed)
   10/11/23 1100  Psych Admission Type (Psych Patients Only)  Admission Status Voluntary  Psychosocial Assessment  Patient Complaints Anxiety  Eye Contact Fair  Facial Expression Anxious  Affect Appropriate to circumstance  Speech Logical/coherent  Interaction Assertive  Motor Activity Slow  Appearance/Hygiene Unremarkable  Behavior Characteristics Cooperative;Calm  Mood Pleasant  Thought Process  Coherency WDL  Content WDL  Delusions None reported or observed  Perception WDL  Hallucination None reported or observed  Judgment Impaired  Confusion Severe  Danger to Self  Current suicidal ideation? Denies  Danger to Others  Danger to Others None reported or observed

## 2023-10-11 NOTE — Plan of Care (Signed)
  Problem: Education: Goal: Emotional status will improve Outcome: Progressing Goal: Verbalization of understanding the information provided will improve Outcome: Progressing   Problem: Activity: Goal: Sleeping patterns will improve Outcome: Progressing   Problem: Health Behavior/Discharge Planning: Goal: Identification of resources available to assist in meeting health care needs will improve Outcome: Progressing   Problem: Physical Regulation: Goal: Ability to maintain clinical measurements within normal limits will improve Outcome: Progressing   Problem: Education: Goal: Knowledge of the prescribed therapeutic regimen will improve Outcome: Progressing

## 2023-10-11 NOTE — [facility] Group Notes (Signed)
 [facility] Group Notes:  (Nursing/MHT/Case Management/Adjunct)  Date:  10/11/2023  Time:  2000  Type of Therapy:   Wrap up group  Participation Level:  Active  Participation Quality:  Appropriate, Attentive, Sharing, and Supportive  Affect:  Appropriate  Cognitive:  Alert  Insight:  Improving  Engagement in Group:  Engaged  Modes of Intervention:  Clarification, Education, and Support  Summary of Progress/Problems: Positive thinking and positive change were discussed.    Marcille Buffy 10/11/2023, 8:47 PM

## 2023-10-12 DIAGNOSIS — F332 Major depressive disorder, recurrent severe without psychotic features: Secondary | ICD-10-CM | POA: Diagnosis not present

## 2023-10-12 MED ORDER — QUETIAPINE FUMARATE 50 MG PO TABS: 50.0000 mg | ORAL_TABLET | Freq: Every day | ORAL | 0 refills | Status: DC

## 2023-10-12 MED ORDER — VENLAFAXINE HCL ER 150 MG PO CP24: 150.0000 mg | ORAL_CAPSULE | Freq: Every day | ORAL | 0 refills | Status: AC

## 2023-10-12 MED ORDER — NICOTINE 14 MG/24HR TD PT24: 14.0000 mg | MEDICATED_PATCH | Freq: Every day | TRANSDERMAL | 0 refills | Status: DC

## 2023-10-12 MED ORDER — POLYETHYLENE GLYCOL 3350 17 G PO PACK
17.0000 g | PACK | Freq: Every day | ORAL | Status: AC
Start: 2023-10-13 — End: ?

## 2023-10-12 MED ORDER — PANTOPRAZOLE SODIUM 40 MG PO TBEC: 80.0000 mg | DELAYED_RELEASE_TABLET | Freq: Every day | ORAL | 0 refills | Status: DC

## 2023-10-12 MED ORDER — ATENOLOL 25 MG PO TABS
25.0000 mg | ORAL_TABLET | Freq: Every morning | ORAL | 0 refills | Status: AC
Start: 2023-10-12 — End: 2024-03-14

## 2023-10-12 MED ORDER — DOCUSATE SODIUM 100 MG PO CAPS: 100.0000 mg | ORAL_CAPSULE | Freq: Every day | ORAL | 0 refills | Status: AC | PRN

## 2023-10-12 MED ORDER — NAPROXEN 500 MG PO TABS: 500.0000 mg | ORAL_TABLET | Freq: Three times a day (TID) | ORAL | Status: AC | PRN

## 2023-10-12 MED ORDER — GABAPENTIN 300 MG PO CAPS: 900.0000 mg | ORAL_CAPSULE | Freq: Three times a day (TID) | ORAL | 0 refills | Status: AC

## 2023-10-12 NOTE — Discharge Instructions (Signed)

## 2023-10-12 NOTE — Progress Notes (Signed)
   10/11/23 2230  Psych Admission Type (Psych Patients Only)  Admission Status Voluntary  Psychosocial Assessment  Patient Complaints Anxiety (anxiety 2/10)  Eye Contact Fair  Facial Expression Anxious  Affect Appropriate to circumstance  Speech Logical/coherent  Interaction Assertive  Motor Activity Slow  Appearance/Hygiene Unremarkable  Behavior Characteristics Cooperative;Calm  Mood Pleasant  Thought Process  Coherency WDL  Content WDL  Delusions None reported or observed  Perception WDL  Hallucination None reported or observed  Judgment Impaired  Confusion Mild  Danger to Self  Current suicidal ideation? Denies  Danger to Others  Danger to Others None reported or observed

## 2023-10-12 NOTE — Group Note (Signed)
 Recreation Therapy Group Note   Group Topic:Stress Management  Group Date: 10/12/2023 Start Time: 4098 End Time: 1002 Facilitators: Keali Mccraw-McCall, LRT,CTRS Location: [address] Dayroom   Group Topic: Stress Management  Goal Area(s) Addresses:  Patient will identify positive stress management techniques. Patient will identify benefits of using stress management post d/c.  Intervention: Insight Timer App  Activity: LRT played a meditation that focused on not holding yourself back. Patients were to visualize what they saw for their future selves. Patients were to then acknowledge the things they feel are holding them back, accept them and work through them to be person they want themselves to be. LRT also explained to patients they could find more meditations on Youtube, apps, scripts, etc.    Education:  Stress Management, Discharge Planning.   Education Outcome: Acknowledges Education   Affect/Mood: Appropriate   Participation Level: Engaged   Participation Quality: Independent   Behavior: Appropriate   Speech/Thought Process: Focused   Insight: Good   Judgement: Good   Modes of Intervention: Meditation   Patient Response to Interventions:  Engaged   Education Outcome:  In group clarification offered    Clinical Observations/Individualized Feedback: Pt attended and participated in group session.     Plan: Continue to engage patient in RT group sessions 2-3x/week.   Asbury Hair-McCall, LRT,CTRS 10/12/2023 12:20 PM

## 2023-10-12 NOTE — [facility] Suicide Risk Assessment (Signed)
 [facility] INPATIENT:  Family/Significant Other Suicide Prevention Education  Suicide Prevention Education:  Suicide Prevention Education was reviewed thoroughly with patient, including risk factors, warning signs, and what to do.  Mobile Crisis services were described and that telephone number pointed out, with encouragement to patient to put this number in personal cell phone.  Brochure was provided to patient to share with natural supports.  Patient acknowledged the ways in which they are at risk, and how working through each of their issues can gradually start to reduce their risk factors.  Patient was encouraged to think of the information in the context of people in their own lives.  Patient denied having access to firearms  Patient verbalized understanding of information provided.  Patient endorsed a desire to live.

## 2023-10-12 NOTE — [facility] Suicide Risk Assessment (Signed)
 Suicide Risk Assessment  Discharge Assessment    [facility] Discharge Suicide Risk Assessment   Principal Problem: MDD (major depressive disorder), recurrent severe, without psychosis (HCC)  Discharge Diagnoses: Principal Problem:   MDD (major depressive disorder), recurrent severe, without psychosis (HCC) Active Problems:   GAD (generalized anxiety disorder)   Delta-9-tetrahydrocannabinol (THC) dependence (HCC)   History of hypertension  Total Time spent with patient: Greater than 30 minutes.  Musculoskeletal: Strength & Muscle Tone: within normal limits Gait & Station: normal Patient leans: N/A  Psychiatric Specialty Exam  Presentation  General Appearance:  Appropriate for Environment; Casual; Fairly Groomed  Eye Contact: Good  Speech: Normal Rate; Clear and Coherent  Speech Volume: Normal  Handedness: Right   Mood and Affect  Mood: Euthymic  Duration of Depression Symptoms: No data recorded Affect: Appropriate; Congruent; Full Range  Thought Process  Thought Processes: Linear  Descriptions of Associations:Intact  Orientation:Full (Time, Place and Person)  Thought Content:Logical  History of Schizophrenia/Schizoaffective disorder:No data recorded Duration of Psychotic Symptoms:No data recorded Hallucinations:Hallucinations: None  Ideas of Reference:None  Suicidal Thoughts:Suicidal Thoughts: No  Homicidal Thoughts:Homicidal Thoughts: No  Sensorium  Memory: Immediate Good; Recent Good; Remote Good  Judgment: Good  Insight: Good  Executive Functions  Concentration: Good  Attention Span: Good  Recall: Good  Fund of Knowledge: Good  Language: Good  Psychomotor Activity  Psychomotor Activity: Psychomotor Activity: Normal  Assets  Assets: Communication Skills; Desire for Improvement; Financial Resources/Insurance; Housing; Resilience; Social Support  Sleep  Sleep: Sleep: Fair Number of Hours of Sleep: 8  Physical  Exam: Physical Exam HENT:     Nose: Nose normal.     Mouth/Throat:     Pharynx: Oropharynx is clear.  Cardiovascular:     Rate and Rhythm: Normal rate.     Pulses: Normal pulses.  Pulmonary:     Effort: Pulmonary effort is normal.  Genitourinary:    Comments: Deferred Musculoskeletal:        General: Normal range of motion.     Cervical back: Normal range of motion.  Skin:    General: Skin is warm.     Comments: S/p self-inflicted laceration wound to left wrist. Wound is without any signs of infections. Mild/slight brownish drainage noted.  Neurological:     General: No focal deficit present.     Mental Status: She is alert and oriented to person, place, and time. Mental status is at baseline.    Review of Systems  Constitutional:  Negative for chills, diaphoresis and fever.  HENT:  Negative for congestion and sore throat.   Respiratory:  Negative for cough, shortness of breath and wheezing.   Cardiovascular:  Negative for chest pain and palpitations.  Gastrointestinal:  Negative for abdominal pain, constipation, diarrhea, heartburn, nausea and vomiting.  Genitourinary:  Negative for dysuria.  Musculoskeletal:  Negative for joint pain and myalgias.  Neurological:  Negative for dizziness, tingling, tremors, sensory change, speech change, focal weakness, seizures, loss of consciousness, weakness and headaches.  Endo/Heme/Allergies:        See allergy lists.  Psychiatric/Behavioral:  Positive for depression (Hx of (stable on medication).) and substance abuse (UDS (+) positive for THC.). Negative for hallucinations, memory loss and suicidal ideas (Hx of suicide attempt by cutting).). The patient has insomnia (Stable on medication.). The patient is not nervous/anxious (Stable on medication.).    Blood pressure (!) 117/57, pulse 78, temperature 98 F (36.7 C), temperature source Oral, resp. rate 16, height 5\' 2"  (1.575 m), weight 68 kg, SpO2 100%.  Body mass index is 27.42  kg/m.  Mental Status Per Nursing Assessment::   On Admission:  Suicidal ideation indicated by patient, Suicidal ideation indicated by others, Self-harm thoughts, Self-harm behaviors  Demographic Factors:  Adolescent or young adult, Divorced or widowed, and Unemployed  Loss Factors: Decrease in vocational status  Historical Factors: Impulsivity  Risk Reduction Factors:   Sense of responsibility to family, Living with another person, especially a relative, Positive social support, Positive therapeutic relationship, and Positive coping skills or problem solving skills  Continued Clinical Symptoms:  Depression:   Impulsivity Alcohol/Substance Abuse/Dependencies Previous Psychiatric Diagnoses and Treatments Medical Diagnoses and Treatments/Surgeries  Cognitive Features That Contribute To Risk:  Polarized thinking    Suicide Risk:  Minimal: No identifiable suicidal ideation.  Patients presenting with no risk factors but with morbid ruminations; may be classified as minimal risk based on the severity of the depressive symptoms   Follow-up Information     [facility], [facility] [employer]. Go on 10/29/2023.   Why: You have an appointment on 10/29/23 at 1:20 pm for medication management and therapy services.     Correct address:  [address], [place], [place] [address] Contact information: [address] [place] [address] [phone]         Arsenio Loader, [facility]. Call.   Why: This is a resource for another provider for therapy and/or medication management services. Contact information: [address] [place] [address] [phone]                Plan Of Care/Follow-up recommendations:  See the discharge evaluation above.  Armandina Stammer, NP, pmhnp, fnp-bc. 10/12/2023, 10:00 AM

## 2023-10-12 NOTE — [facility] Suicide Risk Assessment (Signed)
 [facility] INPATIENT:  Family/Significant Other Suicide Prevention Education  Suicide Prevention Education:  Education Completed; Inetta Fermo (friend) [phone],  (name of family member/significant other) has been identified by the patient as the family member/significant other with whom the patient will be residing, and identified as the person(s) who will aid the patient in the event of a mental health crisis (suicidal ideations/suicide attempt).  With written consent from the patient, the family member/significant other has been provided the following suicide prevention education, prior to the and/or following the discharge of the patient.  The suicide prevention education provided includes the following: Suicide risk factors Suicide prevention and interventions National Suicide Hotline telephone number [facility] assessment telephone number [facility] Emergency Assistance 911 [facility] and/or Residential Mobile Crisis Unit telephone number  Request made of family/significant other to: Remove weapons (e.g., guns, rifles, knives), all items previously/currently identified as safety concern.   Remove drugs/medications (over-the-counter, prescriptions, illicit drugs), all items previously/currently identified as a safety concern.  The family member/significant other verbalizes understanding of the suicide prevention education information provided.  The family member/significant other agrees to remove the items of safety concern listed above.  Kathi Der 10/12/2023, 11:32 AM
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
## 2023-10-12 NOTE — Progress Notes (Signed)
  [facility] Adult Case Management Discharge Plan :  Will you be returning to the same living situation after discharge:  Yes,  pt will be returning home at discharge At discharge, do you have transportation home?: Yes,  CSW arranged [employer] taxi for 1130AM Do you have the ability to pay for your medications: Yes,  pt has active [place] [employer] health insurance  Release of information consent forms completed and in the chart;  Patient's signature needed at discharge.  Patient to Follow up at:  Follow-up Information     [facility], [facility] [employer]. Go on 10/29/2023.   Why: You have an appointment on 10/29/23 at 1:20 pm for medication management and therapy services.     Correct address:  [address] [place], [place] [address] Contact information: [address] [place] [address] [phone]         Arsenio Loader, [facility]. Call.   Why: This is a resource for another provider for therapy and/or medication management services. Contact information: [address] [place] [address] [phone]                 Next level of care provider has access to [facility] Link:no  Safety Planning and Suicide Prevention discussed: Yes,  completed with patient, pt declined consents with anyone else.     Has patient been referred to the Quitline?: Patient refused referral for treatment  Patient has been referred for addiction treatment: No known substance use disorder.  Kathi Der, LCSWA 10/12/2023, 9:17 AM

## 2023-10-12 NOTE — Plan of Care (Signed)
   Problem: Education: Goal: Emotional status will improve Outcome: Progressing Goal: Mental status will improve Outcome: Progressing   Problem: Activity: Goal: Interest or engagement in activities will improve Outcome: Progressing Goal: Sleeping patterns will improve Outcome: Progressing   Problem: Safety: Goal: Periods of time without injury will increase Outcome: Progressing

## 2023-10-12 NOTE — Transportation (Signed)
 10/12/2023  Manasvini E Sherwin DOB: 07/18/1975 MRN: [id number]   RIDER WAIVER AND RELEASE OF LIABILITY  For the purposes of helping with transportation needs, [facility] partners with outside transportation providers (taxi companies, [place], Catering manager.) to give [employer] patients or other approved people the choice of on-demand rides [employer]") to our buildings for non-emergency visits.  By using [employer], I, the person signing this document, on behalf of myself and/or any legal minors (in my care using the [employer]), agree:  Science writer given to me are supplied by independent, outside transportation providers who do not work for, or have any affiliation with, [employer]. [facility] is not a transportation company. [facility] has no control over the quality or safety of the rides I get using [employer]. [place] has no control over whether any outside ride will happen on time or not. [facility] gives no guarantee on the reliability, quality, safety, or availability on any rides, or that no mistakes will happen. I know and accept that traveling by vehicle (car, truck, SVU, Zenaida Niece, bus, taxi, etc.) has risks of serious injuries such as disability, being paralyzed, and death. I know and agree the risk of using [employer] is mine alone, and not [employer]. Transport Services are provided "as is" and as are available. The transportation providers are in charge for all inspections and care of the vehicles used to provide these rides. I agree not to take legal action against [facility], its agents, employees, officers, directors, representatives, insurers, attorneys, assigns, successors, subsidiaries, and affiliates at any time for any reasons related directly or indirectly to using [employer]. I also agree not to take legal action against [facility] or its affiliates for any injury, death, or damage to property caused by or related to using  [employer]. I have read this Waiver and Release of Liability, and I understand the terms used in it and their legal meaning. This Waiver is freely and voluntarily given with the understanding that my right (or any legal minors) to legal action against [place] relating to [employer] is knowingly given up to use these services.   I attest that I read the Ride Waiver and Release of Liability to Cailynn E Cocco, gave Ms. Hostetter the opportunity to ask questions and answered the questions asked (if any). I affirm that Regine E Morikawa then provided consent for assistance with transportation.

## 2023-10-16 ENCOUNTER — Telehealth: Payer: Self-pay

## 2023-10-16 ENCOUNTER — Telehealth: Payer: Self-pay | Admitting: *Deleted

## 2023-10-16 DIAGNOSIS — F339 Major depressive disorder, recurrent, unspecified: Secondary | ICD-10-CM

## 2023-10-16 NOTE — Progress Notes (Unsigned)
 Complex Care Management Note Care Guide Note  10/16/2023 Name: Kimiah E Lienhard MRN: [id number] DOB: 1976-07-07   Complex Care Management Outreach Attempts:  An unsuccessful telephone outreach was attempted today in response to a referral from your Health Plan. Unfortunately, you do not qualify for our program because your primary care provider is with [facility]. Please contact your health plan to get this updated.  Follow Up Plan:  Additional outreach attempts will be made.  Encounter Outcome:  No Answer  Gwenevere Ghazi  [facility]  [facility], [facility], [facility] Guide  Direct Dial: [phone]  Fax [phone]

## 2023-10-17 NOTE — Progress Notes (Unsigned)
 Complex Care Management Note Care Guide Note  10/17/2023 Name: Anne [name] MRN: [id number] DOB: 1976/05/21   Complex Care Management Outreach Attempts: A second unsuccessful outreach was attempted today to offer the patient with information about available complex care management services.  Follow Up Plan:  Additional outreach attempts will be made to offer the patient complex care management information and services.   Encounter Outcome:  No Answer  Gwenevere Ghazi  [facility]  [facility], [facility] Guide  Direct Dial: [phone]  Fax [phone]

## 2023-10-18 NOTE — Progress Notes (Unsigned)
 Complex Care Management Note Care Guide Note  10/18/2023 Name: Anne [name] MRN: [id number] DOB: Jun 02, 1976   Complex Care Management Outreach Attempts: An unsuccessful telephone outreach was attempted today to offer the patient information about available complex care management services.  Follow Up Plan:  Additional outreach attempts will be made to offer the patient complex care management information and services.   Encounter Outcome:  Patient Request to Call Back Gwenevere Ghazi  [facility]  [facility], [facility] Guide  Direct Dial: [phone]  Fax [phone]

## 2023-10-29 DIAGNOSIS — F411 Generalized anxiety disorder: Secondary | ICD-10-CM | POA: Diagnosis not present

## 2023-10-29 DIAGNOSIS — F410 Panic disorder [episodic paroxysmal anxiety] without agoraphobia: Secondary | ICD-10-CM | POA: Diagnosis not present

## 2023-11-26 DIAGNOSIS — F411 Generalized anxiety disorder: Secondary | ICD-10-CM | POA: Diagnosis not present

## 2023-11-26 DIAGNOSIS — F410 Panic disorder [episodic paroxysmal anxiety] without agoraphobia: Secondary | ICD-10-CM | POA: Diagnosis not present

## 2023-12-17 ENCOUNTER — Emergency Department

## 2023-12-17 ENCOUNTER — Emergency Department
Admission: EM | Admit: 2023-12-17 | Discharge: 2023-12-17 | Disposition: A | Discharge status: Home / Self Care | Attending: Emergency Medicine | Admitting: Emergency Medicine

## 2023-12-17 ENCOUNTER — Other Ambulatory Visit: Payer: Self-pay

## 2023-12-17 DIAGNOSIS — J189 Pneumonia, unspecified organism: Secondary | ICD-10-CM

## 2023-12-17 DIAGNOSIS — J181 Lobar pneumonia, unspecified organism: Secondary | ICD-10-CM | POA: Diagnosis not present

## 2023-12-17 DIAGNOSIS — R112 Nausea with vomiting, unspecified: Secondary | ICD-10-CM

## 2023-12-17 DIAGNOSIS — R911 Solitary pulmonary nodule: Secondary | ICD-10-CM | POA: Insufficient documentation

## 2023-12-17 DIAGNOSIS — M542 Cervicalgia: Secondary | ICD-10-CM | POA: Insufficient documentation

## 2023-12-17 DIAGNOSIS — J168 Pneumonia due to other specified infectious organisms: Secondary | ICD-10-CM | POA: Insufficient documentation

## 2023-12-17 DIAGNOSIS — R059 Cough, unspecified: Secondary | ICD-10-CM | POA: Diagnosis present

## 2023-12-17 DIAGNOSIS — R1110 Vomiting, unspecified: Secondary | ICD-10-CM | POA: Diagnosis not present

## 2023-12-17 DIAGNOSIS — R197 Diarrhea, unspecified: Secondary | ICD-10-CM | POA: Diagnosis not present

## 2023-12-17 DIAGNOSIS — R918 Other nonspecific abnormal finding of lung field: Secondary | ICD-10-CM | POA: Diagnosis not present

## 2023-12-17 DIAGNOSIS — R519 Headache, unspecified: Secondary | ICD-10-CM | POA: Diagnosis not present

## 2023-12-17 DIAGNOSIS — I700 Atherosclerosis of aorta: Secondary | ICD-10-CM | POA: Diagnosis not present

## 2023-12-17 LAB — COMPREHENSIVE METABOLIC PANEL WITH GFR
ALT: 10 U/L (ref 0–44)
AST: 11 U/L — ABNORMAL LOW (ref 15–41)
Albumin: 3.4 g/dL — ABNORMAL LOW (ref 3.5–5.0)
Alkaline Phosphatase: 57 U/L (ref 38–126)
Anion gap: 10 (ref 5–15)
BUN: 9 mg/dL (ref 6–20)
CO2: 23 mmol/L (ref 22–32)
Calcium: 9 mg/dL (ref 8.9–10.3)
Chloride: 110 mmol/L (ref 98–111)
Creatinine, Ser: 0.73 mg/dL (ref 0.44–1.00)
GFR, Estimated: >60 mL/min (ref >60)
Glucose, Bld: 110 mg/dL — ABNORMAL HIGH (ref 70–99)
Potassium: 3.5 mmol/L (ref 3.5–5.1)
Sodium: 143 mmol/L (ref 135–145)
Total Bilirubin: 0.4 mg/dL (ref 0.0–1.2)
Total Protein: 7.1 g/dL (ref 6.5–8.1)

## 2023-12-17 LAB — RESP PANEL BY RT-PCR (RSV, FLU A&B, COVID)  RVPGX2
Influenza A by PCR: NEGATIVE
Influenza B by PCR: NEGATIVE
Resp Syncytial Virus by PCR: NEGATIVE
SARS Coronavirus 2 by RT PCR: NEGATIVE

## 2023-12-17 LAB — CBC
HCT: 37.3 % (ref 36.0–46.0)
Hemoglobin: 12.2 g/dL (ref 12.0–15.0)
MCH: 31.6 pg (ref 26.0–34.0)
MCHC: 32.7 g/dL (ref 30.0–36.0)
MCV: 96.6 fL (ref 80.0–100.0)
Platelets: 390 10*3/uL (ref 150–400)
RBC: 3.86 MIL/uL — ABNORMAL LOW (ref 3.87–5.11)
RDW: 13.2 % (ref 11.5–15.5)
WBC: 11.8 10*3/uL — ABNORMAL HIGH (ref 4.0–10.5)
nRBC: 0 % (ref 0.0–0.2)

## 2023-12-17 LAB — LIPASE, BLOOD: Lipase: 36 U/L (ref 11–51)

## 2023-12-17 MED ORDER — AMOXICILLIN-POT CLAVULANATE 875-125 MG PO TABS
1.0000 | ORAL_TABLET | Freq: Two times a day (BID) | ORAL | 0 refills | Status: AC
Start: 2023-12-17 — End: 2023-12-24

## 2023-12-17 MED ORDER — DOXYCYCLINE HYCLATE 100 MG PO TABS
100.0000 mg | ORAL_TABLET | Freq: Two times a day (BID) | ORAL | 0 refills | Status: AC
Start: 2023-12-17 — End: 2023-12-24

## 2023-12-17 MED ORDER — HYDROCODONE-ACETAMINOPHEN 5-325 MG PO TABS
1.0000 | ORAL_TABLET | Freq: Once | ORAL | Status: AC
  Administered 2023-12-17: 1 via ORAL
  Filled 2023-12-17: qty 1

## 2023-12-17 MED ORDER — ALBUTEROL SULFATE HFA 108 (90 BASE) MCG/ACT IN AERS: 2.0000 | INHALATION_SPRAY | Freq: Four times a day (QID) | RESPIRATORY_TRACT | 1 refills | Status: AC | PRN

## 2023-12-17 MED ORDER — SODIUM CHLORIDE 0.9 % IV BOLUS
1000.0000 mL | Freq: Once | INTRAVENOUS | Status: AC
  Administered 2023-12-17: 1000 mL via INTRAVENOUS

## 2023-12-17 MED ORDER — BUDESONIDE-FORMOTEROL FUMARATE 80-4.5 MCG/ACT IN AERO: 2.0000 | INHALATION_SPRAY | Freq: Every day | RESPIRATORY_TRACT | 0 refills | Status: AC

## 2023-12-17 MED ORDER — KETOROLAC TROMETHAMINE 30 MG/ML IJ SOLN
15.0000 mg | Freq: Once | INTRAMUSCULAR | Status: AC
  Administered 2023-12-17: 15 mg via INTRAVENOUS
  Filled 2023-12-17: qty 1

## 2023-12-17 MED ORDER — LORAZEPAM 2 MG/ML IJ SOLN
1.0000 mg | Freq: Once | INTRAMUSCULAR | Status: AC
  Administered 2023-12-17: 1 mg via INTRAVENOUS
  Filled 2023-12-17: qty 1

## 2023-12-17 MED ORDER — MORPHINE SULFATE (PF) 4 MG/ML IV SOLN
4.0000 mg | Freq: Once | INTRAVENOUS | Status: AC
  Administered 2023-12-17: 4 mg via INTRAVENOUS
  Filled 2023-12-17: qty 1

## 2023-12-17 NOTE — ED Notes (Signed)
 See triage note  Presents with some v/d since MAy  States sxs' started May 22nd  Has not been able to seen her PCP  Afebrile on arrival

## 2023-12-17 NOTE — ED Notes (Signed)
Patient given a cup of ice water

## 2023-12-17 NOTE — Discharge Instructions (Addendum)
 You are seen in the ER today for multiple symptoms.  I suspect you may have a viral illness that is contributing to your multiple symptoms.  Your testing was also concerning for possible pneumonia.  It is possible you have an associated lung nodule.  Please follow-up with your primary care doctor or the pulmonary nodule clinic for further evaluation.  Your CT did show some disease in your sinuses, please follow-up with ENT for further evaluation of this.  I sent a prescription for 2 antibiotics to your pharmacy for your pneumonia.  Have also sent a prescription for Symbicort as well as an albuterol  inhaler that you can take as needed.  Return to the ER for any new or worsening symptoms.

## 2023-12-17 NOTE — ED Triage Notes (Addendum)
 Pt comes with vomiting and diarrhea since May. Pt states stiff neck and headache. Pt denies any belly pain. Pt states fever, congestion and cough.

## 2023-12-17 NOTE — ED Notes (Signed)
 Pt given ginger ale. Pt was also informed of need for urine sample

## 2023-12-17 NOTE — ED Provider Notes (Signed)
 [facility] Provider Note    Event Date/Time   First MD Initiated Contact with Patient 12/17/23 1540     (approximate)   History   Emesis   HPI  Anne [name] is a 48 year old female presenting to the ER for evaluation of vomiting with multiple associated complaints.  Patient reports that around May 22 she had onset of multiple episodes of nonbloody vomiting and diarrhea.  The next day she had onset of a generalized headache, not sudden in onset, persistent since that time.  She is continue to have symptoms over the last 10 days.  Over the past few days has had some generalized pain over the back of her neck.  Additionally reports cough and congestion.  Reports a distant history of migraines in the past.  Has not been able to keep down her medication over the past few days due to her persistent vomiting.  Reports to all antiemetics she has trialed in the past.    Physical Exam   Triage Vital Signs: ED Triage Vitals  Encounter Vitals Group     BP 12/17/23 1519 93/71     Systolic BP Percentile --      Diastolic BP Percentile --      Pulse Rate 12/17/23 1519 87     Resp 12/17/23 1519 18     Temp 12/17/23 1519 98.7 F (37.1 C)     Temp src --      SpO2 12/17/23 1519 98 %     Weight 12/17/23 1517 155 lb (70.3 kg)     Height 12/17/23 1517 5\' 2"  (1.575 m)     Head Circumference --      Peak Flow --      Pain Score 12/17/23 1516 8     Pain Loc --      Pain Education --      Exclude from Growth Chart --     Most recent vital signs: Vitals:   12/17/23 2006 12/17/23 2032  BP: (!) 105/54   Pulse: 67   Resp: 18   Temp:  98.3 F (36.8 C)  SpO2: 100%      General: Awake, interactive  HEENT: Pupils equal and reactive, normal extraocular movements.  Mild generalized tenderness over the back of the neck, pain not significantly worsened with flexion.  Mild tenderness over the sinuses bilaterally. CV:  Regular rate, good peripheral perfusion.   Resp:  Unlabored respirations, lung sounds coarse bilaterally Abd:  Nondistended, soft, no significant tenderness to palpation Neuro:  Alert and oriented, normal extraocular movements, symmetric facial movement, sensation intact over bilateral upper and lower extremities with 5 out of 5 strength.  Normal finger-to-nose testing.   ED Results / Procedures / Treatments   Labs (all labs ordered are listed, but only abnormal results are displayed) Labs Reviewed  COMPREHENSIVE METABOLIC PANEL WITH GFR - Abnormal; Notable for the following components:      Result Value   Glucose, Bld 110 (*)    Albumin 3.4 (*)    AST 11 (*)    All other components within normal limits  CBC - Abnormal; Notable for the following components:   WBC 11.8 (*)    RBC 3.86 (*)    All other components within normal limits  RESP PANEL BY RT-PCR (RSV, FLU A&B, COVID)  RVPGX2  LIPASE, BLOOD     EKG EKG independently reviewed and interpreted by myself demonstrates:    RADIOLOGY Imaging independently reviewed and interpreted by myself  demonstrates:  CXR demonstrates possible right upper lobe nodule, radiology recommends CT CT head without acute abnormality, does demonstrate debris in the sphenoid sinus CT chest further redemonstrates right upper lobe nodule, per radiology possibly reflective of infectious, inflammatory process, or neoplasm  Formal Radiology Read:  CT Head Wo Contrast Result Date: 12/17/2023 CLINICAL DATA:  Headache, increasing frequency or severity EXAM: CT HEAD WITHOUT CONTRAST TECHNIQUE: Contiguous axial images were obtained from the base of the skull through the vertex without intravenous contrast. RADIATION DOSE REDUCTION: This exam was performed according to the departmental dose-optimization program which includes automated exposure control, adjustment of the mA and/or kV according to patient size and/or use of iterative reconstruction technique. COMPARISON:  Remote head CT 07/03/2014  FINDINGS: Brain: No intracranial hemorrhage, mass effect, or midline shift. No hydrocephalus. The basilar cisterns are patent. No evidence of territorial infarct or acute ischemia. No extra-axial or intracranial fluid collection. Vascular: No hyperdense vessel or unexpected calcification. Skull: No fracture or focal lesion. Sinuses/Orbits: Mucosal thickening and partial opacification of the included paranasal sinuses. Bubbly debris in left side of sphenoid sinus. No mastoid effusion. Other: None. IMPRESSION: 1. No acute intracranial abnormality. 2. Paranasal sinus disease. Electronically Signed   By: Chadwick Colonel M.D.   On: 12/17/2023 19:22   CT Chest Wo Contrast Result Date: 12/17/2023 CLINICAL DATA:  Follow-up lung nodule greater than 8 mm. Vomiting and diarrhea since May. Stiff neck and headache. Fever, congestion, and cough. EXAM: CT CHEST WITHOUT CONTRAST TECHNIQUE: Multidetector CT imaging of the chest was performed following the standard protocol without IV contrast. RADIATION DOSE REDUCTION: This exam was performed according to the departmental dose-optimization program which includes automated exposure control, adjustment of the mA and/or kV according to patient size and/or use of iterative reconstruction technique. COMPARISON:  Chest radiograph 12/17/2023.  CT chest 05/14/2019 FINDINGS: Cardiovascular: Normal heart size. Small pericardial effusion. Normal caliber thoracic aorta. Minimal aortic calcification. Mediastinum/Nodes: Thyroid gland is unremarkable. Esophagus is decompressed. Prominent mediastinal lymph nodes, with largest pretracheal lymph node measuring 12 mm short axis dimension. This is nonspecific but may be reactive. Lungs/Pleura: Focal cavitary nodule with irregular margins and surrounding gland glass infiltrates in the right upper lung, corresponding to chest radiographic abnormality. This measures about 1.6 cm in diameter. Differential diagnosis would include neoplasm such as  squamous cell carcinoma, or inflammatory process such is atypical pneumonia or septic embolus. There are additional vague patchy ground-glass changes seen throughout both lungs. No pleural effusion or pneumothorax. 3 mm subpleural nodule in the left lower lung. Upper Abdomen: Surgical absence of the gallbladder.  No acute Musculoskeletal: Abnormalities. IMPRESSION: 1. 1.6 cm diameter irregular cavitary nodule in the right upper lung corresponding to chest radiographic abnormality. Differential diagnosis includes neoplasm or infectious/inflammatory process. Consider one of the following in 3 months for both low-risk and high-risk individuals: (a) repeat chest CT, (b) follow-up PET-CT, or (c) tissue sampling. This recommendation follows the consensus statement: Guidelines for Management of Incidental Pulmonary Nodules Detected on CT Images: From the Fleischner Society 2017; Radiology 2017; 284:228-243. 2. Suggestion of vague patchy infiltrates in the lungs. 3. Minimal aortic atherosclerosis. Electronically Signed   By: Boyce Byes M.D.   On: 12/17/2023 19:21   DG Chest 2 View Result Date: 12/17/2023 CLINICAL DATA:  Cough with vomiting. EXAM: CHEST - 2 VIEW COMPARISON:  CT of the chest 05/14/2019 FINDINGS: The heart size and mediastinal contours are within normal limits. There is no focal lung consolidation, pleural effusion or pneumothorax. Questionable right upper lobe  nodular densities seen best on the lateral view measuring 18 mm. No pleural effusion or pneumothorax. The visualized skeletal structures are unremarkable. IMPRESSION: Questionable right upper lobe nodular densities seen best on the lateral view measuring 18 mm. Recommend further evaluation with CT of the chest. Electronically Signed   By: Tyron Gallon M.D.   On: 12/17/2023 17:51    PROCEDURES:  Critical Care performed: No  Procedures   MEDICATIONS ORDERED IN ED: Medications  sodium chloride  0.9 % bolus 1,000 mL (1,000 mLs  Intravenous New Bag/Given 12/17/23 1620)  ketorolac (TORADOL) 30 MG/ML injection 15 mg (15 mg Intravenous Given 12/17/23 1618)  LORazepam  (ATIVAN ) injection 1 mg (1 mg Intravenous Given 12/17/23 1841)  HYDROcodone -acetaminophen  (NORCO/VICODIN) 5-325 MG per tablet 1 tablet (1 tablet Oral Given 12/17/23 1841)  morphine (PF) 4 MG/ML injection 4 mg (4 mg Intravenous Given 12/17/23 2032)     IMPRESSION / MDM / ASSESSMENT AND PLAN / ED COURSE  I reviewed the triage vital signs and the nursing notes.  Differential diagnosis includes, but is not limited to, viral illness, pneumonia, sinusitis, pancreatitis, low suspicion acute intra-abdominal process in the absence of abdominal pain and reassuring abdominal exam, very low suspicion for bacterial meningitis, consideration for viral meningitis, but overall lower suspicion based on exam and history  Patient's presentation is most consistent with acute presentation with potential threat to life or bodily function.  48 year old female presenting with multiple complaints consistent with viral syndrome.  Stable vitals on presentation.  Labs sent from triage with mild leukocytosis WC of 11.8, overall reassuring CMP.  Normal lipase.  Reports poor tolerance of antiemetics, but reports headache has typically improved with IV fluids in the past.  Ordered for fluids as well as Toradol.  Patient is overall well-appearing, not altered, has a normal neurologic exam.  10 days into his symptoms, I have extremely low suspicion for bacterial meningitis.  I did discuss small possibility of viral meningitis, but evaluation for this would require lumbar puncture and would likely not significantly change management.  With this, I did discuss deferring on lumbar puncture and patient is comfortable with this.  X-Jameelah Watts ordered to further evaluate for pneumonia.  Will treat symptomatically and reevaluate.  9:41 PM Chest x-Shaunette Gassner with urine for upper lobe nodule.  CT chest ordered to further  evaluate which does demonstrate a cavitary nodule.  In the setting of acute infectious symptoms, do think it is reasonable to treat with antibiotics with plans for outpatient follow-up.  Will place referral to pulmonary nodule clinic.  On reevaluation, patient did initially have ongoing headache for which CT head was added without acute abnormalities.  She does note that this headache is identical to headaches she has had in the past related to sinus disease which was noted on her head CT.  Did discuss that antibiotics for pneumonia should hopefully treat a bacterial sinusitis as well.  She has followed up with ENT in the past and had prior sinus surgery, will give information for follow-up with ENT.  On reevaluation after treatment, patient does report improvement.  She is comfortable with discharge home.  Will discharge with antibiotics with Augmentin  and doxycycline .  She does additionally report that she does not have her home Symbicort or albuterol  inhaler, sent refills for these. Strict return precautions provided.  Patient discharged in stable condition.     FINAL CLINICAL IMPRESSION(S) / ED DIAGNOSES   Final diagnoses:  Nausea vomiting and diarrhea  Lung nodule  Pneumonia of right upper lobe due  to infectious organism  Acute nonintractable headache, unspecified headache type     Rx / DC Orders   ED Discharge Orders          Ordered    budesonide-formoterol (SYMBICORT) 80-4.5 MCG/ACT inhaler  Daily        12/17/23 2141    albuterol  (VENTOLIN  HFA) 108 (90 Base) MCG/ACT inhaler  Every 6 hours PRN        12/17/23 2141    amoxicillin -clavulanate (AUGMENTIN ) 875-125 MG tablet  2 times daily        12/17/23 2141    doxycycline  (VIBRA -TABS) 100 MG tablet  2 times daily        12/17/23 2141             Note:  This document was prepared using Dragon voice recognition software and may include unintentional dictation errors.   Claria Crofts, MD 12/17/23 2322

## 2023-12-17 NOTE — ED Notes (Signed)
 Patient transported to CT

## 2023-12-31 DIAGNOSIS — F411 Generalized anxiety disorder: Secondary | ICD-10-CM | POA: Diagnosis not present

## 2023-12-31 DIAGNOSIS — F410 Panic disorder [episodic paroxysmal anxiety] without agoraphobia: Secondary | ICD-10-CM | POA: Diagnosis not present

## 2024-01-10 DIAGNOSIS — F332 Major depressive disorder, recurrent severe without psychotic features: Secondary | ICD-10-CM | POA: Diagnosis not present

## 2024-01-10 DIAGNOSIS — M797 Fibromyalgia: Secondary | ICD-10-CM | POA: Diagnosis not present

## 2024-01-10 DIAGNOSIS — Z5181 Encounter for therapeutic drug level monitoring: Secondary | ICD-10-CM | POA: Diagnosis not present

## 2024-01-10 DIAGNOSIS — K2100 Gastro-esophageal reflux disease with esophagitis, without bleeding: Secondary | ICD-10-CM | POA: Diagnosis not present

## 2024-01-10 DIAGNOSIS — F419 Anxiety disorder, unspecified: Secondary | ICD-10-CM | POA: Diagnosis not present

## 2024-01-10 DIAGNOSIS — R55 Syncope and collapse: Secondary | ICD-10-CM | POA: Diagnosis not present

## 2024-01-11 DIAGNOSIS — F332 Major depressive disorder, recurrent severe without psychotic features: Secondary | ICD-10-CM | POA: Diagnosis not present

## 2024-01-12 DIAGNOSIS — F332 Major depressive disorder, recurrent severe without psychotic features: Secondary | ICD-10-CM | POA: Diagnosis not present

## 2024-01-13 DIAGNOSIS — F332 Major depressive disorder, recurrent severe without psychotic features: Secondary | ICD-10-CM | POA: Diagnosis not present

## 2024-01-14 DIAGNOSIS — F332 Major depressive disorder, recurrent severe without psychotic features: Secondary | ICD-10-CM | POA: Diagnosis not present

## 2024-01-17 DIAGNOSIS — F332 Major depressive disorder, recurrent severe without psychotic features: Secondary | ICD-10-CM | POA: Diagnosis not present

## 2024-01-28 ENCOUNTER — Other Ambulatory Visit: Payer: Self-pay

## 2024-01-28 ENCOUNTER — Emergency Department

## 2024-01-28 ENCOUNTER — Emergency Department
Admission: EM | Admit: 2024-01-28 | Discharge: 2024-01-28 | Disposition: A | Discharge status: Home / Self Care | Attending: Emergency Medicine | Admitting: Emergency Medicine

## 2024-01-28 DIAGNOSIS — I10 Essential (primary) hypertension: Secondary | ICD-10-CM | POA: Diagnosis not present

## 2024-01-28 DIAGNOSIS — R079 Chest pain, unspecified: Secondary | ICD-10-CM | POA: Diagnosis not present

## 2024-01-28 DIAGNOSIS — R0789 Other chest pain: Secondary | ICD-10-CM | POA: Diagnosis not present

## 2024-01-28 LAB — COMPREHENSIVE METABOLIC PANEL WITH GFR
ALT: 12 U/L (ref 0–44)
AST: 14 U/L — ABNORMAL LOW (ref 15–41)
Albumin: 3.2 g/dL — ABNORMAL LOW (ref 3.5–5.0)
Alkaline Phosphatase: 57 U/L (ref 38–126)
Anion gap: 8 (ref 5–15)
BUN: 13 mg/dL (ref 6–20)
CO2: 22 mmol/L (ref 22–32)
Calcium: 8.6 mg/dL — ABNORMAL LOW (ref 8.9–10.3)
Chloride: 109 mmol/L (ref 98–111)
Creatinine, Ser: 0.78 mg/dL (ref 0.44–1.00)
GFR, Estimated: >60 mL/min (ref >60)
Glucose, Bld: 114 mg/dL — ABNORMAL HIGH (ref 70–99)
Potassium: 3.6 mmol/L (ref 3.5–5.1)
Sodium: 139 mmol/L (ref 135–145)
Total Bilirubin: 0.7 mg/dL (ref 0.0–1.2)
Total Protein: 5.8 g/dL — ABNORMAL LOW (ref 6.5–8.1)

## 2024-01-28 LAB — CBC
HCT: 34.4 % — ABNORMAL LOW (ref 36.0–46.0)
Hemoglobin: 11.5 g/dL — ABNORMAL LOW (ref 12.0–15.0)
MCH: 31.4 pg (ref 26.0–34.0)
MCHC: 33.4 g/dL (ref 30.0–36.0)
MCV: 94 fL (ref 80.0–100.0)
Platelets: 229 K/uL (ref 150–400)
RBC: 3.66 MIL/uL — ABNORMAL LOW (ref 3.87–5.11)
RDW: 13.1 % (ref 11.5–15.5)
WBC: 8.9 K/uL (ref 4.0–10.5)
nRBC: 0 % (ref 0.0–0.2)

## 2024-01-28 LAB — TROPONIN I (HIGH SENSITIVITY): Troponin I (High Sensitivity): <2 ng/L (ref <18)

## 2024-01-28 MED ORDER — LORAZEPAM 1 MG PO TABS
1.0000 mg | ORAL_TABLET | Freq: Once | ORAL | Status: AC
  Administered 2024-01-28: 1 mg via ORAL
  Filled 2024-01-28: qty 1

## 2024-01-28 MED ORDER — ACETAMINOPHEN 325 MG PO TABS
650.0000 mg | ORAL_TABLET | Freq: Once | ORAL | Status: AC
  Administered 2024-01-28: 650 mg via ORAL
  Filled 2024-01-28: qty 2

## 2024-01-28 NOTE — ED Provider Notes (Signed)
 [facility] Provider Note    Event Date/Time   First MD Initiated Contact with Patient 01/28/24 1127     (approximate)   History   Chest Pain   HPI  Anne [name] is a 48 y.o. female with a history of anxiety, POTS who presents after an episode of chest pain, now significantly improved.  She reports this happened this morning.  She denies shortness of breath, no pleurisy.  No fevers or chills, she thinks this may be related to her POTS syndrome, she reports she has outpatient specialty follow-up but missed her last appointment.     Physical Exam   Triage Vital Signs: ED Triage Vitals  Encounter Vitals Group     BP 01/28/24 1129 103/61     Girls Systolic BP Percentile --      Girls Diastolic BP Percentile --      Boys Systolic BP Percentile --      Boys Diastolic BP Percentile --      Pulse Rate 01/28/24 1129 83     Resp 01/28/24 1129 18     Temp 01/28/24 1129 98.2 F (36.8 C)     Temp Source 01/28/24 1129 Oral     SpO2 01/28/24 1129 100 %     Weight --      Height --      Head Circumference --      Peak Flow --      Pain Score 01/28/24 1126 6     Pain Loc --      Pain Education --      Exclude from Growth Chart --     Most recent vital signs: Vitals:   01/28/24 1129 01/28/24 1236  BP: 103/61 102/66  Pulse: 83 81  Resp: 18 18  Temp: 98.2 F (36.8 C) 98.3 F (36.8 C)  SpO2: 100% 100%     General: Awake, no distress. CV:  Good peripheral perfusion.  Regular rate and rhythm Resp:  Normal effort.  Clear to auscultation bilaterally Abd:  No distention.  Other:     ED Results / Procedures / Treatments   Labs (all labs ordered are listed, but only abnormal results are displayed) Labs Reviewed  CBC - Abnormal; Notable for the following components:      Result Value   RBC 3.66 (*)    Hemoglobin 11.5 (*)    HCT 34.4 (*)    All other components within normal limits  COMPREHENSIVE METABOLIC PANEL WITH GFR - Abnormal; Notable  for the following components:   Glucose, Bld 114 (*)    Calcium 8.6 (*)    Total Protein 5.8 (*)    Albumin 3.2 (*)    AST 14 (*)    All other components within normal limits  TROPONIN I (HIGH SENSITIVITY)     EKG  ED ECG REPORT I, Lamar Price, the attending physician, personally viewed and interpreted this ECG.  Date: 01/28/2024  Rhythm: normal sinus rhythm QRS Axis: normal Intervals: normal ST/T Wave abnormalities: Nonspecific changes Narrative Interpretation: no evidence of acute ischemia    RADIOLOGY Chest x-ray viewed interpret by me, no acute abnormality    PROCEDURES:  Critical Care performed:   Procedures   MEDICATIONS ORDERED IN ED: Medications  acetaminophen  (TYLENOL ) tablet 650 mg (650 mg Oral Given 01/28/24 1134)  LORazepam  (ATIVAN ) tablet 1 mg (1 mg Oral Given 01/28/24 1134)     IMPRESSION / MDM / ASSESSMENT AND PLAN / ED COURSE  I reviewed  the triage vital signs and the nursing notes. Patient's presentation is most consistent with acute presentation with potential threat to life or bodily function.  Patient presents with chest pain as detailed above, overall she is in no distress, differential includes ACS, pneumonia, esophagitis, anxiety  EKG and high sensitivity troponin are reassuring  Chest x-ray without evidence of pneumonia or pneumothorax.  Patient's symptoms have resolved without intervention.  No indication for admission at this time, given normal troponin, reassuring workup greater than 3 hours after beginning of discomfort.  She is now asymptomatic.  She agrees with this plan        FINAL CLINICAL IMPRESSION(S) / ED DIAGNOSES   Final diagnoses:  Nonspecific chest pain     Rx / DC Orders   ED Discharge Orders     None        Note:  This document was prepared using Dragon voice recognition software and may include unintentional dictation errors.   Arlander Charleston, MD 01/28/24 [phone]

## 2024-01-28 NOTE — ED Triage Notes (Addendum)
 From [facility], states woke from sleep at 9am with central chest pain/ pressure, dizziness, radiating to R and L arm. No cardiac hx. Hx of [place] and anxiety. States this maybe anxiety related due to homelessness.

## 2024-02-05 DIAGNOSIS — F410 Panic disorder [episodic paroxysmal anxiety] without agoraphobia: Secondary | ICD-10-CM | POA: Diagnosis not present

## 2024-02-05 DIAGNOSIS — F411 Generalized anxiety disorder: Secondary | ICD-10-CM | POA: Diagnosis not present

## 2024-02-15 DIAGNOSIS — F4322 Adjustment disorder with anxiety: Secondary | ICD-10-CM | POA: Diagnosis not present

## 2024-02-27 DIAGNOSIS — F411 Generalized anxiety disorder: Secondary | ICD-10-CM | POA: Diagnosis not present

## 2024-02-27 DIAGNOSIS — F410 Panic disorder [episodic paroxysmal anxiety] without agoraphobia: Secondary | ICD-10-CM | POA: Diagnosis not present

## 2024-03-12 DIAGNOSIS — F411 Generalized anxiety disorder: Secondary | ICD-10-CM | POA: Diagnosis not present

## 2024-03-12 DIAGNOSIS — F410 Panic disorder [episodic paroxysmal anxiety] without agoraphobia: Secondary | ICD-10-CM | POA: Diagnosis not present

## 2024-03-14 ENCOUNTER — Emergency Department: Admission: EM | Admit: 2024-03-14 | Discharge: 2024-03-15 | Disposition: A | Discharge status: Home / Self Care

## 2024-03-14 ENCOUNTER — Encounter: Payer: Self-pay | Admitting: Intensive Care

## 2024-03-14 ENCOUNTER — Other Ambulatory Visit: Payer: Self-pay

## 2024-03-14 DIAGNOSIS — F1999 Other psychoactive substance use, unspecified with unspecified psychoactive substance-induced disorder: Secondary | ICD-10-CM | POA: Diagnosis not present

## 2024-03-14 DIAGNOSIS — F32A Depression, unspecified: Secondary | ICD-10-CM

## 2024-03-14 DIAGNOSIS — R45851 Suicidal ideations: Secondary | ICD-10-CM | POA: Diagnosis not present

## 2024-03-14 DIAGNOSIS — F1290 Cannabis use, unspecified, uncomplicated: Secondary | ICD-10-CM | POA: Diagnosis not present

## 2024-03-14 DIAGNOSIS — F332 Major depressive disorder, recurrent severe without psychotic features: Secondary | ICD-10-CM | POA: Diagnosis not present

## 2024-03-14 DIAGNOSIS — Z5900 Homelessness unspecified: Secondary | ICD-10-CM | POA: Diagnosis not present

## 2024-03-14 DIAGNOSIS — F6089 Other specific personality disorders: Secondary | ICD-10-CM | POA: Diagnosis not present

## 2024-03-14 DIAGNOSIS — F1920 Other psychoactive substance dependence, uncomplicated: Secondary | ICD-10-CM

## 2024-03-14 HISTORY — DX: Opioid abuse, uncomplicated: F11.10

## 2024-03-14 LAB — URINE DRUG SCREEN, QUALITATIVE (ARMC ONLY)
Amphetamines, Ur Screen: NOT DETECTED
Barbiturates, Ur Screen: NOT DETECTED
Benzodiazepine, Ur Scrn: NOT DETECTED
Cannabinoid 50 Ng, Ur ~~LOC~~: POSITIVE — AB
Cocaine Metabolite,Ur ~~LOC~~: NOT DETECTED
MDMA (Ecstasy)Ur Screen: NOT DETECTED
Methadone Scn, Ur: NOT DETECTED
Opiate, Ur Screen: NOT DETECTED
Phencyclidine (PCP) Ur S: NOT DETECTED
Tricyclic, Ur Screen: POSITIVE — AB

## 2024-03-14 LAB — COMPREHENSIVE METABOLIC PANEL WITH GFR
ALT: 9 U/L (ref 0–44)
AST: 16 U/L (ref 15–41)
Albumin: 4.1 g/dL (ref 3.5–5.0)
Alkaline Phosphatase: 69 U/L (ref 38–126)
Anion gap: 10 (ref 5–15)
BUN: 13 mg/dL (ref 6–20)
CO2: 20 mmol/L — ABNORMAL LOW (ref 22–32)
Calcium: 9.4 mg/dL (ref 8.9–10.3)
Chloride: 111 mmol/L (ref 98–111)
Creatinine, Ser: 0.68 mg/dL (ref 0.44–1.00)
GFR, Estimated: >60 mL/min (ref >60)
Glucose, Bld: 116 mg/dL — ABNORMAL HIGH (ref 70–99)
Potassium: 3.4 mmol/L — ABNORMAL LOW (ref 3.5–5.1)
Sodium: 141 mmol/L (ref 135–145)
Total Bilirubin: 0.5 mg/dL (ref 0.0–1.2)
Total Protein: 7.6 g/dL (ref 6.5–8.1)

## 2024-03-14 LAB — CBC
HCT: 38.7 % (ref 36.0–46.0)
Hemoglobin: 13 g/dL (ref 12.0–15.0)
MCH: 30.8 pg (ref 26.0–34.0)
MCHC: 33.6 g/dL (ref 30.0–36.0)
MCV: 91.7 fL (ref 80.0–100.0)
Platelets: 256 K/uL (ref 150–400)
RBC: 4.22 MIL/uL (ref 3.87–5.11)
RDW: 13.7 % (ref 11.5–15.5)
WBC: 13 K/uL — ABNORMAL HIGH (ref 4.0–10.5)
nRBC: 0 % (ref 0.0–0.2)

## 2024-03-14 LAB — ETHANOL: Alcohol, Ethyl (B): <15 mg/dL (ref <15)

## 2024-03-14 MED ORDER — IBUPROFEN 600 MG PO TABS
600.0000 mg | ORAL_TABLET | Freq: Once | ORAL | Status: AC
  Administered 2024-03-14: 600 mg via ORAL
  Filled 2024-03-14: qty 1

## 2024-03-14 MED ORDER — ADULT MULTIVITAMIN W/MINERALS CH
1.0000 | ORAL_TABLET | Freq: Every day | ORAL | Status: DC
  Administered 2024-03-14 – 2024-03-15 (×2): 1 via ORAL
  Filled 2024-03-14 (×2): qty 1

## 2024-03-14 MED ORDER — POLYETHYLENE GLYCOL 3350 17 G PO PACK: 17.0000 g | PACK | Freq: Every day | ORAL | Status: DC

## 2024-03-14 MED ORDER — NICOTINE 14 MG/24HR TD PT24
14.0000 mg | MEDICATED_PATCH | Freq: Every day | TRANSDERMAL | Status: DC
  Administered 2024-03-14 – 2024-03-15 (×2): 14 mg via TRANSDERMAL
  Filled 2024-03-14 (×2): qty 1

## 2024-03-14 MED ORDER — ALBUTEROL SULFATE HFA 108 (90 BASE) MCG/ACT IN AERS: 2.0000 | INHALATION_SPRAY | Freq: Four times a day (QID) | RESPIRATORY_TRACT | Status: DC | PRN

## 2024-03-14 MED ORDER — GABAPENTIN 300 MG PO CAPS
900.0000 mg | ORAL_CAPSULE | Freq: Three times a day (TID) | ORAL | Status: DC
  Administered 2024-03-14 – 2024-03-15 (×2): 900 mg via ORAL
  Filled 2024-03-14 (×2): qty 3

## 2024-03-14 MED ORDER — QUETIAPINE FUMARATE 25 MG PO TABS
50.0000 mg | ORAL_TABLET | Freq: Every day | ORAL | Status: DC
  Administered 2024-03-14: 50 mg via ORAL
  Filled 2024-03-14: qty 2

## 2024-03-14 MED ORDER — ATENOLOL 25 MG PO TABS
25.0000 mg | ORAL_TABLET | Freq: Every morning | ORAL | Status: DC
  Filled 2024-03-14: qty 1

## 2024-03-14 MED ORDER — IBUPROFEN 600 MG PO TABS: 600.0000 mg | ORAL_TABLET | Freq: Three times a day (TID) | ORAL | Status: DC | PRN

## 2024-03-14 MED ORDER — CLONAZEPAM 0.5 MG PO TABS
0.5000 mg | ORAL_TABLET | Freq: Every day | ORAL | Status: DC | PRN
  Administered 2024-03-14 – 2024-03-15 (×2): 0.5 mg via ORAL
  Filled 2024-03-14 (×2): qty 1

## 2024-03-14 NOTE — ED Triage Notes (Signed)
 PAtient states I have just been having bad thoughts of hurting myself this past week. I have been going through a lot these past few months and its hard to ask for help. I went to [facility] and they turned me away and said I wasn't in crisis  Patient tearful in triage  Reports she sees a psychiatrist and is taking her prescriptions. However has not had her medications today.   

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
## 2024-03-14 NOTE — ED Provider Notes (Signed)
 [facility] Provider Note    Event Date/Time   First MD Initiated Contact with Patient 03/14/24 1827     (approximate)   History   Mental Health Problem   HPI  Anne [name] is a 48 y.o. female  emale with a history of anxiety, POTS and major depressive disorder who presents today with 1 week of passive SI and depressive thoughts.  Patient reports multiple social stressors in her life.  She cannot help but think she would be better off dead. She has no definitive plan for suicide and recognize this thought is wrong and does want help.  Denies any physical complaints at this time.  She does not have any access to guns.  She has required inpatient hospitalization for major depressive disorder in the past      Physical Exam   Triage Vital Signs: ED Triage Vitals  Encounter Vitals Group     BP 03/14/24 1751 125/67     Girls Systolic BP Percentile --      Girls Diastolic BP Percentile --      Boys Systolic BP Percentile --      Boys Diastolic BP Percentile --      Pulse Rate 03/14/24 1751 71     Resp 03/14/24 1751 16     Temp 03/14/24 1751 98.9 F (37.2 C)     Temp Source 03/14/24 1751 Oral     SpO2 03/14/24 1751 98 %     Weight 03/14/24 1754 160 lb (72.6 kg)     Height 03/14/24 1754 5' 2 (1.575 m)     Head Circumference --      Peak Flow --      Pain Score 03/14/24 1751 7     Pain Loc --      Pain Education --      Exclude from Growth Chart --     Most recent vital signs: Vitals:   03/14/24 1751  BP: 125/67  Pulse: 71  Resp: 16  Temp: 98.9 F (37.2 C)  SpO2: 98%    Nursing Triage Note reviewed. Vital signs reviewed and patients oxygen saturation is normoxic  General: Patient is well nourished, well developed, awake and alert, resting comfortably in no acute distress Head: Normocephalic and atraumatic Eyes: Normal inspection, extraocular muscles intact, no conjunctival pallor Ear, nose, throat: Normal external exam Neck:  Normal range of motion Respiratory: Patient is in no respiratory distress, lungs CTAB Cardiovascular: Patient is not tachycardic, RRR without murmur appreciated GI: Abd SNT with no guarding or rebound  Back: Normal inspection of the back with good strength and range of motion throughout all ext Extremities: pulses intact with good cap refills, no LE pitting edema or calf tenderness Neuro: The patient is alert and oriented to person, place, and time, appropriately conversive, with 5/5 bilat UE/LE strength, no gross motor or sensory defects noted. Coordination appears to be adequate. Skin: Warm, dry, and intact Psych: Passive SI, depressed mood, no HI, not responding to internal stimuli  ED Results / Procedures / Treatments   Labs (all labs ordered are listed, but only abnormal results are displayed) Labs Reviewed  COMPREHENSIVE METABOLIC PANEL WITH GFR - Abnormal; Notable for the following components:      Result Value   Potassium 3.4 (*)    CO2 20 (*)    Glucose, Bld 116 (*)    All other components within normal limits  CBC - Abnormal; Notable for the following components:   WBC  13.0 (*)    All other components within normal limits  ETHANOL  URINE DRUG SCREEN, QUALITATIVE ([facility] ONLY)     EKG None  RADIOLOGY None    PROCEDURES:  Critical Care performed: No  Procedures   MEDICATIONS ORDERED IN ED: Medications  ibuprofen  (ADVIL ) tablet 600 mg (has no administration in time range)  ibuprofen  (ADVIL ) tablet 600 mg (has no administration in time range)  albuterol  (VENTOLIN  HFA) 108 (90 Base) MCG/ACT inhaler 2 puff (has no administration in time range)  atenolol  (TENORMIN ) tablet 25 mg (has no administration in time range)  clonazePAM  (KLONOPIN ) tablet 0.5-1 mg (has no administration in time range)  gabapentin  (NEURONTIN ) capsule 900 mg (has no administration in time range)  multivitamin with minerals tablet 1 tablet (has no administration in time range)  nicotine   (NICODERM CQ  - dosed in mg/24 hours) patch 14 mg (has no administration in time range)  polyethylene glycol (MIRALAX  / GLYCOLAX ) packet 17 g (has no administration in time range)  QUEtiapine  (SEROQUEL ) tablet 50 mg (has no administration in time range)     IMPRESSION / MDM / ASSESSMENT AND PLAN / ED COURSE                                Differential diagnosis includes, but is not limited to, organic psychiatric disorder, electrolyte derangement, anemia,   ED course: Patient presents and has no physical complaints.  She presents voluntarily and has passive SI.  At this time I do not think she meets criteria for an involuntary hold she is willing to stay.  Lab work is reassuring.  Patient is medically cleared for psychiatric disposition at this time   Clinical Course as of 03/14/24 2145  Fri Mar 14, 2024  1916 Alcohol, Ethyl (B): <15 [HD]  1916 WBC(!): 13.0 Denies any infectious symptoms [HD]  1916 Sodium: 141 No profound electrolyte derangements [HD]  2145 Medication recommendations: no changes needed she states meds are fine. She is searching for housing ; Non-Medication/therapeutic recommendations: no sitter needed There are no psychiatric contraindications to discharge at this time   [HD]    Clinical Course User Index [HD] Nicholaus Rolland BRAVO, MD   Risk: 5 This patient has a high risk of morbidity due to further diagnostic testing or treatment. Rationale: This patient's evaluation and management involve a high risk of morbidity due to the potential severity of presenting symptoms, need for diagnostic testing, and/or initiation of treatment that may require close monitoring. The differential includes conditions with potential for significant deterioration or requiring escalation of care. Treatment decisions in the ED, including medication administration, procedural interventions, or disposition planning, reflect this level of risk. Additional Support: -- Drug therapy requiring intensive  monitoring for toxicity [ ]  -- Decision regarding elective major surgery with idenitified patient or procedure risk factors [ ]  -- Decision regarding hospitalization or escalation of hospital-level care [ ]  -- Decision not to resuscitate or to de-escalate care because of poor prognosis [ ]  -- Parental controlled substances [ ]   COPA: 5 The patient has a severe exacerbation, progression, or side effect of treatment of the following illness/illnesses: []  OR  The patient has the following acute or chronic illness/injury that poses a possible threat to life or bodily function: [X] : The patient has a potentially serious acute condition or an acute exacerbation of a chronic illness requiring urgent evaluation and management in the Emergency Department. The clinical presentation necessitates immediate consideration  of life-threatening or function-threatening diagnoses, even if they are ultimately ruled out.  Data(2/3 categories following were performed): 5 I reviewed or ordered at least three unique tests, external notes, and/or the history required an independent historian as one of the three requirements as following: CBC, BMP, ethanol level AND  I independently interpreted the following test: []  OR  I discussed the management of the patient with the following external physician or qualified healthcare provider: Psychiatrist/behavioral health specialist  Suggested E/M Coding Level: 5, 99285, This has been selected based on the Mar 27, 2022 CPT guidelines for E/M codes in the Emergency Department based on 2/3 of the CoPA, Data, and Risk.   FINAL CLINICAL IMPRESSION(S) / ED DIAGNOSES   Final diagnoses:  Passive suicidal ideations     Rx / DC Orders   ED Discharge Orders     None        Note:  This document was prepared using Dragon voice recognition software and may include unintentional dictation errors.   Nicholaus Rolland BRAVO, MD 03/14/24 AVA

## 2024-03-14 NOTE — ED Provider Notes (Signed)
 Patient was initially cleared for discharge by overnight psychiatry Dr. Laurence Ivanoff.  He states that there would be no medication changes and if patient had a place to go she can be discharged.  I went and we interviewed the patient and she still states that she was surprised to find out that she was cleared by psychiatry as that was not what she was told by psychiatry.  She tells me that she does not have a place to go tonight and that if she were to leave she would commit suicide.  I reached back out to Dr. Ivanoff via chat regarding this, but there has been no response.  Consequently we will keep the patient here in the morning and have psych reevaluate. Will sign out to oncoming physician   Nicholaus Rolland BRAVO, MD 03/14/24 [phone]

## 2024-03-14 NOTE — ED Notes (Signed)
 PT doing video call with psychiatrist.

## 2024-03-14 NOTE — ED Notes (Signed)
 Ed provider updated pt that she was cleared for discharge. Pt stated that she would commit suicide if she was discharged. That she wanted to be admitted to a psychiatric facility. ED provider reached back out to psychiatrist to see if they needed to reassess pt.

## 2024-03-15 LAB — PREGNANCY, URINE: Preg Test, Ur: NEGATIVE

## 2024-03-15 NOTE — TOC Initial Note (Addendum)
 Transition of Care [facility]) - Initial/Assessment Note    Patient Details  Name: Anne [name] MRN: [id number] Date of Birth: 1976-01-10  [facility] CM/SW Contact:    Corrie JINNY Ruts, LCSW Phone Number: 03/15/2024, 10:45 AM  Clinical Narrative:                 Chart Reviewed. The patient was admitted to the [facility] ED for mental health concerns. I spoke with the patient at bedside today. I introduced myself, my role, and reason for consult. The patient reports that she is homeless and has no support. The patient confirms that she has a PCP Zebedee Sacks at [facility]. The patient confirms that she uses Patent attorney. The patient reports that she spoke with [facility] in the past and was not helpful. The patient also reports going to [facility] and they could not provide her with anything. The patient was not interested in shelters in surrounding counties.   I informed the patient that I would add local transportation, shelter, and food resources to her discharge paper work and she can follow up with the local organizations. The patient verbalized understanding.   I encouraged the patient to reach out to CSW if any other concerns arise before discharge.    Barriers to Discharge: Continued Medical Work up   Patient Goals and CMS Choice            Expected Discharge Plan and Services                                              Prior Living Arrangements/Services     Patient language and need for interpreter reviewed:: Yes                 Activities of Daily Living      Permission Sought/Granted                  Emotional Assessment Appearance:: Appears stated age Attitude/Demeanor/Rapport: Engaged Affect (typically observed): Calm, Hopeless Orientation: : Oriented to Self, Oriented to Place, Oriented to  Time, Oriented to Situation Alcohol / Substance Use: Not Applicable Psych Involvement: Yes (comment)  Admission diagnosis:  Mental Health  Eval Patient Active Problem List   Diagnosis Date Noted   Delta-9-tetrahydrocannabinol (THC) dependence (HCC) 10/08/2023   History of hypertension 10/08/2023   Episode of recurrent major depressive disorder (HCC) 10/06/2023   GAD (generalized anxiety disorder) 10/06/2023   Suicide attempt by inadequate means (HCC) 10/06/2023   Laceration of left wrist 10/06/2023   MDD (major depressive disorder), recurrent severe, without psychosis (HCC) 10/06/2023   PCP:  Patient, No Pcp Per Pharmacy:   [facility] [address] ([address]), [place] - [address] [place] ([place]) [place] [address] Phone: [phone] Fax: [phone]  [facility] [address], [place] - [address] [place] [address] Phone: [phone] Fax: [phone]  [facility] [place], [place] - [address] [place] [address] Phone: [phone] Fax: [phone]     Social Drivers of Health (SDOH) Social History: SDOH Screenings   Food Insecurity: Food Insecurity Present (11/27/2023)   Received from [facility]  Housing: Low Risk  (10/07/2023)  Transportation Needs: Unmet Transportation Needs (11/27/2023)   Received from [facility]  Utilities: Low Risk  (11/27/2023)  Received from [facility]  Alcohol Screen: Low Risk  (10/07/2023)  Financial Resource Strain: High Risk (11/27/2023)   Received from [facility]  Tobacco Use: High Risk (03/14/2024)  Health Literacy: Low Risk  (09/22/2022)   Received from [facility]   SDOH Interventions:     Readmission Risk Interventions     No data to display

## 2024-03-15 NOTE — ED Notes (Signed)
 Pt moved to [facility] with ED tech and security. Pt ambulatory with steady gait, escorted to her room, and oriented to the unit. Pt verbalized understanding.

## 2024-03-15 NOTE — ED Notes (Signed)
 Pt provided with breakfast tray.

## 2024-03-15 NOTE — ED Notes (Signed)
 Thorough review of shelters, transportation, church contacts, and food resources discussed with pt. Pt stated understanding.

## 2024-03-15 NOTE — ED Notes (Signed)
 Medicine (Effexor ) brought in for patient and given to this RN at first nurse desk. Medicine taken to [facility] in [place]

## 2024-03-15 NOTE — ED Provider Notes (Signed)
 Emergency Medicine Observation Re-evaluation Note  Anne [name] is a 48 y.o. female, seen on rounds today.  Pt initially presented to the ED for complaints of Mental Health Problem Currently, the patient is sleeping without distress.  Physical Exam  BP 110/64 (BP Location: Right Arm)   Pulse 60   Temp 98.2 F (36.8 C) (Oral)   Resp 18   Ht 5' 2 (1.575 m)   Wt 72.6 kg   SpO2 100%   BMI 29.26 kg/m  Physical Exam General: Resting comfortablyCardiac:  Lungs: Even unlabored respiration Psych: Calm  ED Course / MDM  EKG:   I have reviewed the labs performed to date as well as medications administered while in observation.    Plan  Current plan is for psych reeval today (see Dr. Nicholaus note 'Consequently we will keep the patient here in the morning and have psych reevaluate.'). Noted need for reassessment with Anne [name] TTS and Dr. Drury Dicky Anes, MD 03/15/24 [phone]

## 2024-03-15 NOTE — Discharge Instructions (Addendum)
 You have been seen in the Emergency Department (ED) today for a psychiatric complaint.  You have been evaluated by psychiatry and we believe you are safe to be discharged from the hospital.    Please return to the ED immediately if you have ANY thoughts of hurting yourself or anyone else, so that we may help you.  Please avoid alcohol and drug use.  Follow up with your doctor and/or therapist as soon as possible regarding today's ED visit.   Please follow up any other recommendations and clinic appointments provided by the psychiatry team that saw you in the Emergency Department.    In [facility], there are several resources where you can find free food assistance. These are typically community-based programs or food banks that aim to help individuals and families in need. Here are some options:  1. [employer] Wheels  Description: Meals on Wheels delivers nutritious meals to homebound individuals, including seniors who are unable to prepare their own meals. Contact: [phone] Website: [place] [employer] Wheels](https://www.mealsonwheelsalamance.org/)  2. [place] [employer] [facility]  Description**: This pantry provides free groceries to those in need in [place]. It is open several days a week. Contact**: [phone] Location**: [address] [place], [place]  3. [employer] researcher, broadcasting/film/video**  Description**: Offers free groceries to individuals and families who are in crisis. They operate out of several locations and partner with local churches. Contact**: [phone] Website**: [employer]](http://www.loavesandfishesnc.org/) Location**: [address], [place], [place]  4. **[employer] of [place] - 2-1-1**  * **Description**: This is a helpline to connect individuals with available community services, including food pantries and assistance programs. * **Contact**: Dial 211 or visit their website. * **Website**: [[employer] Way  211](https://www.unitedwaync.org/2-1-1)  ### 5. **[place] [employer]**  Description**: [employer] offers emergency food assistance, along with other services, including shelter and financial help. Contact**: [phone] Location**: [address] [place], [place] Website**: [employer] [place]](https://www.salvationarmycarolinas.org/[place]/)  6. [facility] [employer] Extension - Food Resource Guides**  Description**: The extension office often partners with food programs and can provide up-to-date information on where free food resources are available. Contact**: [phone] Website**: [[place] [employer] Extension](https://[place].ces.ncsu.edu/)  7. **[employer] [place]**  Description**: A local non-profit focused on alleviating hunger by distributing food through community partnerships. Contact**: [phone] Website**: [[employer] [place]](http://feedingalamance.org/)   8. [employer] ([place])**  Description: Some local gardens offer free produce for community members in need. You may want to check with local churches or community centers for access. Contact: Check with local community organizations for details.

## 2024-03-15 NOTE — ED Notes (Signed)
Pt refused shower.  

## 2024-03-15 NOTE — ED Provider Notes (Signed)
 Patient has been seen and evaluated this morning now by Dr. Grover of psychiatry.  He advises the patient may be discharged.  Outpatient follow-up is indicated.  The patient is not felt to be at high risk, and is appropriate for discharge.   Dicky Anes, MD 03/15/24 1055

## 2024-04-04 DIAGNOSIS — F410 Panic disorder [episodic paroxysmal anxiety] without agoraphobia: Secondary | ICD-10-CM | POA: Diagnosis not present

## 2024-04-04 DIAGNOSIS — F411 Generalized anxiety disorder: Secondary | ICD-10-CM | POA: Diagnosis not present

## 2024-05-14 DIAGNOSIS — F410 Panic disorder [episodic paroxysmal anxiety] without agoraphobia: Secondary | ICD-10-CM | POA: Diagnosis not present

## 2024-05-14 DIAGNOSIS — F411 Generalized anxiety disorder: Secondary | ICD-10-CM | POA: Diagnosis not present

## 2024-06-09 DIAGNOSIS — E785 Hyperlipidemia, unspecified: Secondary | ICD-10-CM | POA: Diagnosis not present

## 2024-06-09 DIAGNOSIS — I700 Atherosclerosis of aorta: Secondary | ICD-10-CM | POA: Diagnosis not present

## 2024-06-09 DIAGNOSIS — M797 Fibromyalgia: Secondary | ICD-10-CM | POA: Diagnosis not present

## 2024-06-09 DIAGNOSIS — R911 Solitary pulmonary nodule: Secondary | ICD-10-CM | POA: Diagnosis not present

## 2024-06-09 DIAGNOSIS — F32A Depression, unspecified: Secondary | ICD-10-CM | POA: Diagnosis not present

## 2024-06-09 DIAGNOSIS — F419 Anxiety disorder, unspecified: Secondary | ICD-10-CM | POA: Diagnosis not present

## 2024-06-09 DIAGNOSIS — J449 Chronic obstructive pulmonary disease, unspecified: Secondary | ICD-10-CM | POA: Diagnosis not present

## 2024-06-23 DIAGNOSIS — J984 Other disorders of lung: Secondary | ICD-10-CM | POA: Diagnosis not present

## 2024-06-23 DIAGNOSIS — R911 Solitary pulmonary nodule: Secondary | ICD-10-CM | POA: Diagnosis not present

## 2024-06-23 DIAGNOSIS — R918 Other nonspecific abnormal finding of lung field: Secondary | ICD-10-CM | POA: Diagnosis not present

## 2024-06-26 DIAGNOSIS — Z1331 Encounter for screening for depression: Secondary | ICD-10-CM | POA: Diagnosis not present

## 2024-06-26 DIAGNOSIS — F17210 Nicotine dependence, cigarettes, uncomplicated: Secondary | ICD-10-CM | POA: Diagnosis not present

## 2024-06-26 DIAGNOSIS — J449 Chronic obstructive pulmonary disease, unspecified: Secondary | ICD-10-CM | POA: Diagnosis not present

## 2024-06-26 DIAGNOSIS — J4489 Other specified chronic obstructive pulmonary disease: Secondary | ICD-10-CM | POA: Diagnosis not present

## 2024-07-03 DIAGNOSIS — J4489 Other specified chronic obstructive pulmonary disease: Secondary | ICD-10-CM | POA: Diagnosis not present

## 2024-07-28 ENCOUNTER — Emergency Department: Payer: MEDICAID

## 2024-07-28 ENCOUNTER — Encounter: Payer: Self-pay | Admitting: Emergency Medicine

## 2024-07-28 ENCOUNTER — Other Ambulatory Visit: Payer: Self-pay

## 2024-07-28 ENCOUNTER — Emergency Department
Admission: EM | Admit: 2024-07-28 | Discharge: 2024-07-29 | Disposition: A | Payer: MEDICAID | Attending: Emergency Medicine | Admitting: Emergency Medicine

## 2024-07-28 DIAGNOSIS — S80211A Abrasion, right knee, initial encounter: Secondary | ICD-10-CM | POA: Insufficient documentation

## 2024-07-28 DIAGNOSIS — W19XXXA Unspecified fall, initial encounter: Secondary | ICD-10-CM

## 2024-07-28 DIAGNOSIS — W010XXA Fall on same level from slipping, tripping and stumbling without subsequent striking against object, initial encounter: Secondary | ICD-10-CM | POA: Insufficient documentation

## 2024-07-28 DIAGNOSIS — S8991XA Unspecified injury of right lower leg, initial encounter: Secondary | ICD-10-CM | POA: Diagnosis present

## 2024-07-28 NOTE — ED Triage Notes (Signed)
 Pt presents to the ED via POV with complaints of fall tonight after bringing in her groceries a fell on her R side. She notes tripping over her R ankle and elbowing herself in the R ribs. Rates the pain 8/10 - last took ibuprofen  around 1900 without improvement. A&Ox4 at this time. Denies taking thinners, no LOC, hit her head, CP or SOB.

## 2024-07-29 MED ORDER — OXYCODONE HCL 5 MG PO TABS
5.0000 mg | ORAL_TABLET | Freq: Once | ORAL | Status: AC
  Administered 2024-07-29: 5 mg via ORAL
  Filled 2024-07-29: qty 1

## 2024-07-29 NOTE — Discharge Instructions (Signed)
Please take Tylenol and ibuprofen/Advil for your pain.  It is safe to take them together, or to alternate them every few hours.  Take up to 1000mg of Tylenol at a time, up to 4 times per day.  Do not take more than 4000 mg of Tylenol in 24 hours.  For ibuprofen, take 400-600 mg, 3 - 4 times per day.  

## 2024-07-29 NOTE — ED Provider Notes (Signed)
 "  [facility] Provider Note    Event Date/Time   First MD Initiated Contact with Patient 07/29/24 0003     (approximate)   History   Fall   HPI  Anne [name] is a 49 y.o. female who presents to the ED for evaluation of Fall   Patient presents to the ED for the ration of right sided pain after mechanical fall today.  No syncope, has been up and ambulatory since the   Physical Exam   Triage Vital Signs: ED Triage Vitals  Encounter Vitals Group     BP 07/28/24 2003 124/76     Girls Systolic BP Percentile --      Girls Diastolic BP Percentile --      Boys Systolic BP Percentile --      Boys Diastolic BP Percentile --      Pulse Rate 07/28/24 2003 83     Resp 07/28/24 2003 18     Temp 07/28/24 2003 99 F (37.2 C)     Temp src --      SpO2 07/28/24 2003 96 %     Weight 07/28/24 2001 161 lb 2.5 oz (73.1 kg)     Height 07/28/24 2001 5' 2 (1.575 m)     Head Circumference --      Peak Flow --      Pain Score 07/28/24 2003 8     Pain Loc --      Pain Education --      Exclude from Growth Chart --     Most recent vital signs: Vitals:   07/28/24 2003 07/29/24 0011  BP: 124/76 128/79  Pulse: 83 79  Resp: 18 16  Temp: 99 F (37.2 C) 98.6 F (37 C)  SpO2: 96% 97%    General: Awake, no distress.  Ambulatory independently with a normal gait CV:  Good peripheral perfusion.  Resp:  Normal effort.  Abd:  No distention.  Soft and nontender MSK:  No deformity noted.  Abrasion to right anterior knee but full active range of motion Neuro:  No focal deficits appreciated. Other:     ED Results / Procedures / Treatments   Labs (all labs ordered are listed, but only abnormal results are displayed) Labs Reviewed - No data to display  EKG   RADIOLOGY CXR interpreted by me without evidence of acute cardiopulmonary pathology. Plain film of the right knee interpreted by me without evidence of fracture or dislocation Plain film of the right  ankle interpreted by me without evidence of fracture or dislocation  Official radiology report(s): DG Ribs Unilateral W/Chest Right Result Date: 07/28/2024 EXAM: 1 VIEW(S) XRAY OF THE RIGHT RIBS AND CHEST 07/28/2024 08:52:00 PM COMPARISON: None available. CLINICAL HISTORY: fall FINDINGS: BONES: No acute displaced rib fracture. LUNGS AND PLEURA: No consolidation or pulmonary edema. No pleural effusion or pneumothorax. HEART AND MEDIASTINUM: No acute abnormality of the cardiac and mediastinal silhouettes. Cholecystectomy clips noted. IMPRESSION: 1. No acute rib fracture. 2. Cholecystectomy clips. Electronically signed by: Dorethia Molt MD MD 07/28/2024 09:40 PM EST RP Workstation: [id number]   DG Knee Complete 4 Views Right Result Date: 07/28/2024 EXAM: 4 VIEW(S) XRAY OF THE RIGHT KNEE 07/28/2024 08:52:00 PM COMPARISON: None available. CLINICAL HISTORY: 809823 Fall 190176 FINDINGS: BONES AND JOINTS: No acute fracture. No malalignment. No significant joint effusion. Calcific density adjacent to the medial femoral condyle may reflect a remote right MCL injury. SOFT TISSUES: Unremarkable. IMPRESSION: 1. No acute fracture or dislocation. Electronically signed  by: Dorethia Molt MD MD 07/28/2024 09:39 PM EST RP Workstation: [id number]   DG Ankle Complete Right Result Date: 07/28/2024 EXAM: 3 VIEW(S) XRAY OF THE RIGHT ANKLE 07/28/2024 08:52:00 PM CLINICAL HISTORY: fall COMPARISON: None available. FINDINGS: BONES AND JOINTS: No acute fracture. Old healed distal fibular fracture. Small corticated density at the medial malleolus consistent with remote injury. Small plantar calcaneal spur. No malalignment. SOFT TISSUES: Unremarkable. IMPRESSION: 1. No acute osseous abnormality of the ankle. 2. Old healed distal fibular fracture, small corticated density at the medial malleolus consistent with remote injury, and small plantar calcaneal spur. Electronically signed by: Dorethia Molt MD MD 07/28/2024 09:32 PM EST RP  Workstation: [id number]    PROCEDURES and INTERVENTIONS:  Procedures  Medications  oxyCODONE  (Oxy IR/ROXICODONE ) immediate release tablet 5 mg (5 mg Oral Given 07/29/24 0010)     IMPRESSION / MDM / ASSESSMENT AND PLAN / ED COURSE  I reviewed the triage vital signs and the nursing notes.  Differential diagnosis includes, but is not limited to, pneumothorax, rib fracture, ankle fracture, dislocation, syncope  {Patient presents with symptoms of an acute illness or injury that is potentially life-threatening.  Patient presents after mechanical fall with no severe injury and suitable for outpatient management.  Minor abrasion to the right knee.  Reassuring x-rays.  Ambulatory with controlled pain.  Suitable for outpatient management      FINAL CLINICAL IMPRESSION(S) / ED DIAGNOSES   Final diagnoses:  Fall, initial encounter     Rx / DC Orders   ED Discharge Orders     None        Note:  This document was prepared using Dragon voice recognition software and may include unintentional dictation errors.   Claudene Rover, MD 07/29/24 0021  "

## 2024-08-01 ENCOUNTER — Emergency Department
Admission: EM | Admit: 2024-08-01 | Discharge: 2024-08-01 | Disposition: A | Discharge status: Home / Self Care | Payer: MEDICAID | Attending: Emergency Medicine | Admitting: Emergency Medicine

## 2024-08-01 ENCOUNTER — Other Ambulatory Visit: Payer: Self-pay

## 2024-08-01 ENCOUNTER — Emergency Department: Payer: MEDICAID

## 2024-08-01 DIAGNOSIS — S060XAA Concussion with loss of consciousness status unknown, initial encounter: Secondary | ICD-10-CM | POA: Diagnosis not present

## 2024-08-01 DIAGNOSIS — S0990XA Unspecified injury of head, initial encounter: Secondary | ICD-10-CM | POA: Diagnosis present

## 2024-08-01 DIAGNOSIS — W19XXXA Unspecified fall, initial encounter: Secondary | ICD-10-CM | POA: Diagnosis not present

## 2024-08-01 DIAGNOSIS — R519 Headache, unspecified: Secondary | ICD-10-CM

## 2024-08-01 LAB — CBC WITH DIFFERENTIAL/PLATELET
Abs Immature Granulocytes: 0.16 K/uL — ABNORMAL HIGH (ref 0.00–0.07)
Basophils Absolute: 0 K/uL (ref 0.0–0.1)
Basophils Relative: 0 %
Eosinophils Absolute: 0.2 K/uL (ref 0.0–0.5)
Eosinophils Relative: 1 %
HCT: 35.6 % — ABNORMAL LOW (ref 36.0–46.0)
Hemoglobin: 11.4 g/dL — ABNORMAL LOW (ref 12.0–15.0)
Immature Granulocytes: 1 %
Lymphocytes Relative: 41 %
Lymphs Abs: 4.6 K/uL — ABNORMAL HIGH (ref 0.7–4.0)
MCH: 31.1 pg (ref 26.0–34.0)
MCHC: 32 g/dL (ref 30.0–36.0)
MCV: 97.3 fL (ref 80.0–100.0)
Monocytes Absolute: 0.6 K/uL (ref 0.1–1.0)
Monocytes Relative: 6 %
Neutro Abs: 5.7 K/uL (ref 1.7–7.7)
Neutrophils Relative %: 51 %
Platelets: 244 K/uL (ref 150–400)
RBC: 3.66 MIL/uL — ABNORMAL LOW (ref 3.87–5.11)
RDW: 14 % (ref 11.5–15.5)
Smear Review: NORMAL
WBC: 11.2 K/uL — ABNORMAL HIGH (ref 4.0–10.5)
nRBC: 0 % (ref 0.0–0.2)

## 2024-08-01 LAB — BASIC METABOLIC PANEL WITH GFR
Anion gap: 11 (ref 5–15)
BUN: 9 mg/dL (ref 6–20)
CO2: 20 mmol/L — ABNORMAL LOW (ref 22–32)
Calcium: 8.9 mg/dL (ref 8.9–10.3)
Chloride: 111 mmol/L (ref 98–111)
Creatinine, Ser: 1.15 mg/dL — ABNORMAL HIGH (ref 0.44–1.00)
GFR, Estimated: 59 mL/min — ABNORMAL LOW
Glucose, Bld: 111 mg/dL — ABNORMAL HIGH (ref 70–99)
Potassium: 3.3 mmol/L — ABNORMAL LOW (ref 3.5–5.1)
Sodium: 142 mmol/L (ref 135–145)

## 2024-08-01 MED ORDER — BUTALBITAL-APAP-CAFFEINE 50-325-40 MG PO TABS: 1.0000 | ORAL_TABLET | Freq: Four times a day (QID) | ORAL | 0 refills | Status: AC | PRN

## 2024-08-01 MED ORDER — DIPHENHYDRAMINE HCL 50 MG/ML IJ SOLN
12.5000 mg | Freq: Once | INTRAMUSCULAR | Status: DC
  Filled 2024-08-01: qty 1

## 2024-08-01 MED ORDER — SODIUM CHLORIDE 0.9 % IV BOLUS
1000.0000 mL | Freq: Once | INTRAVENOUS | Status: AC
  Administered 2024-08-01: 1000 mL via INTRAVENOUS

## 2024-08-01 MED ORDER — HALOPERIDOL LACTATE 5 MG/ML IJ SOLN
5.0000 mg | Freq: Once | INTRAMUSCULAR | Status: AC
  Administered 2024-08-01: 5 mg via INTRAVENOUS
  Filled 2024-08-01: qty 1

## 2024-08-01 MED ORDER — DIPHENHYDRAMINE HCL 50 MG/ML IJ SOLN
12.5000 mg | Freq: Once | INTRAMUSCULAR | Status: AC
  Administered 2024-08-01: 12.5 mg via INTRAVENOUS

## 2024-08-01 MED ORDER — FENTANYL CITRATE (PF) 50 MCG/ML IJ SOSY
50.0000 ug | PREFILLED_SYRINGE | Freq: Once | INTRAMUSCULAR | Status: AC
  Administered 2024-08-01: 50 ug via INTRAVENOUS
  Filled 2024-08-01: qty 1

## 2024-08-01 NOTE — ED Triage Notes (Signed)
 Pt now endorsing hx of migraine. States the tingling and pain in back of head is not a typical aura for her.

## 2024-08-01 NOTE — ED Triage Notes (Signed)
 Ambulatory to triage. Reports posterior h/a and emesis. Acute onset 0200 that woke pt from sleep. Denies any new fall or injury but reports hitting head a few days ago and being seen at that time. Pt denies abd pain, fever, diarrhea. Reports forehead feels lumpy and states this is new tonight after emesis. No acute neuro deficits noted. Pt alert and oriented following commands. Breathing unlabored speaking in full sentences with symmetric chest rise and fall.   Pt now endorses tingling in both hands intermittent.

## 2024-08-01 NOTE — ED Provider Notes (Signed)
.----------------------------------------- °  7:10 AM on 08/01/2024 -----------------------------------------  Blood pressure 107/86, pulse 73, temperature 97.8 F (36.6 C), temperature source Oral, resp. rate 18, height 5' 2 (1.575 m), weight 74.8 kg, SpO2 98%.  Assuming care from Dr. Floy.  In short, Anne [name] is a 49 y.o. female with a chief complaint of headache after fall 4 days ago.  Refer to the original H&P for additional details.  The current plan of care is to reassess vitals after fluids.  On reassessment patient is feeling significantly better, no complaints at this time.  Discussed with her about imaging and lab results.  Repeat blood pressures are 118/82.  Considered but indication for inpatient admission at this time, she safe for outpatient management.  Will discharge with strict return precautions and instructions follow-up primary care next week for reassessment.       Medications  sodium chloride  0.9 % bolus 1,000 mL (1,000 mLs Intravenous New Bag/Given 08/01/24 0515)  fentaNYL  (SUBLIMAZE ) injection 50 mcg (50 mcg Intravenous Given 08/01/24 0518)  haloperidol  lactate (HALDOL ) injection 5 mg (5 mg Intravenous Given 08/01/24 0617)  diphenhydrAMINE  (BENADRYL ) injection 12.5 mg (12.5 mg Intravenous Given 08/01/24 0620)     ED Discharge Orders          Ordered    butalbital -acetaminophen -caffeine  (FIORICET) 50-325-40 MG tablet  Every 6 hours PRN        08/01/24 0646           Final diagnoses:  Concussion with unknown loss of consciousness status, initial encounter  Nonintractable headache, unspecified chronicity pattern, unspecified headache type      Waymond Lorelle Cummins, MD 08/01/24 [phone]

## 2024-08-01 NOTE — ED Provider Notes (Signed)
 "  [facility] Provider Note    Event Date/Time   First MD Initiated Contact with Patient 08/01/24 [phone]     (approximate)   History   Emesis   HPI  Anne [name] is a 49 y.o. female who presents to the emergency department today because of concerns for headache, nausea and vomiting.  Patient states that she did have a fall 4 days ago.  She was seen in the emergency department.  Had not had any headache at that time.  However when the headache started this morning she also noticed a bump on her forehead.  Patient states she has a history of migraines but this does not remind her of her migraine headaches.  She denies any fevers.     Physical Exam   Triage Vital Signs: ED Triage Vitals  Encounter Vitals Group     BP 08/01/24 0435 107/86     Girls Systolic BP Percentile --      Girls Diastolic BP Percentile --      Boys Systolic BP Percentile --      Boys Diastolic BP Percentile --      Pulse Rate 08/01/24 0435 98     Resp 08/01/24 0435 18     Temp 08/01/24 0435 98.1 F (36.7 C)     Temp Source 08/01/24 0435 Oral     SpO2 08/01/24 0435 100 %     Weight 08/01/24 0433 165 lb (74.8 kg)     Height 08/01/24 0433 5' 2 (1.575 m)     Head Circumference --      Peak Flow --      Pain Score 08/01/24 0433 7     Pain Loc --      Pain Education --      Exclude from Growth Chart --     Most recent vital signs: Vitals:   08/01/24 0435  BP: 107/86  Pulse: 98  Resp: 18  Temp: 98.1 F (36.7 C)  SpO2: 100%   General: Awake, alert, oriented. CV:  Good peripheral perfusion. Regular rate and rhythm. Resp:  Normal effort. Lungs clear. Abd:  No distention.  Other:  Hematoma to forehead, small bruise to bridge of nose.    ED Results / Procedures / Treatments   Labs (all labs ordered are listed, but only abnormal results are displayed) Labs Reviewed  CBC WITH DIFFERENTIAL/PLATELET - Abnormal; Notable for the following components:      Result Value    WBC 11.2 (*)    RBC 3.66 (*)    Hemoglobin 11.4 (*)    HCT 35.6 (*)    Lymphs Abs 4.6 (*)    Abs Immature Granulocytes 0.16 (*)    All other components within normal limits  BASIC METABOLIC PANEL WITH GFR - Abnormal; Notable for the following components:   Potassium 3.3 (*)    CO2 20 (*)    Glucose, Bld 111 (*)    Creatinine, Ser 1.15 (*)    GFR, Estimated 59 (*)    All other components within normal limits     EKG  None   RADIOLOGY I independently interpreted and visualized the CT head/cervical spine. My interpretation: No ICH Radiology interpretation:  IMPRESSION:  1. No acute intracranial abnormality.  2. No acute fracture or traumatic malalignment of the cervical spine.  3. Mild posterior ethmoid mucosal thickening, most consistent with mild ethmoid  sinusitis; consider outpatient evaluation if symptoms persist.  4. Reversal of normal cervical lordosis, which  may reflect the patient's  positioning or muscle spasm.  5. Mild cervical spondylosis.      PROCEDURES:  Critical Care performed: No    MEDICATIONS ORDERED IN ED: Medications  diphenhydrAMINE  (BENADRYL ) injection 12.5 mg (has no administration in time range)  sodium chloride  0.9 % bolus 1,000 mL (1,000 mLs Intravenous New Bag/Given 08/01/24 0515)  fentaNYL  (SUBLIMAZE ) injection 50 mcg (50 mcg Intravenous Given 08/01/24 0518)  haloperidol  lactate (HALDOL ) injection 5 mg (5 mg Intravenous Given 08/01/24 0617)     IMPRESSION / MDM / ASSESSMENT AND PLAN / ED COURSE  I reviewed the triage vital signs and the nursing notes.                              Differential diagnosis includes, but is not limited to, concussion, tension headache, migraine, ICH  Patient's presentation is most consistent with acute presentation with potential threat to life or bodily function.   Patient presented to the emergency department today because of concerns for headache and nausea and vomiting.  On exam here patient awake  alert and oriented.  Did obtain head and neck CT given her recent fall.  This was negative.  I have low concern for ICH or SAH given negative imaging.  No fractures.  Blood work does suggest some dehydration with elevated creatinine levels.  I discussed this with the patient.  Will give patient IV fluids and medication to help with headache.  I do wonder if patient is suffering from postconcussive syndrome.  I did discuss this with the patient.  Did recommend brain rest.     FINAL CLINICAL IMPRESSION(S) / ED DIAGNOSES   Final diagnoses:  Bad headache  Concussion with unknown loss of consciousness status, initial encounter      Note:  This document was prepared using Dragon voice recognition software and may include unintentional dictation errors.    Floy Roberts, MD 08/01/24 [phone]  "
